# Patient Record
Sex: Male | Born: 1935 | Race: Black or African American | Hispanic: No | Marital: Married | Smoking: Former smoker
Health system: Southern US, Community
[De-identification: ages and names within clinical notes are randomized; demographics above are authoritative.]

## PROBLEM LIST (undated history)

## (undated) DIAGNOSIS — N183 Chronic kidney disease, stage 3 unspecified: Secondary | ICD-10-CM

## (undated) DIAGNOSIS — E669 Obesity, unspecified: Secondary | ICD-10-CM

## (undated) DIAGNOSIS — R001 Bradycardia, unspecified: Secondary | ICD-10-CM

## (undated) DIAGNOSIS — K922 Gastrointestinal hemorrhage, unspecified: Secondary | ICD-10-CM

## (undated) DIAGNOSIS — Z794 Long term (current) use of insulin: Secondary | ICD-10-CM

## (undated) DIAGNOSIS — G4733 Obstructive sleep apnea (adult) (pediatric): Secondary | ICD-10-CM

## (undated) DIAGNOSIS — I639 Cerebral infarction, unspecified: Secondary | ICD-10-CM

## (undated) DIAGNOSIS — E119 Type 2 diabetes mellitus without complications: Secondary | ICD-10-CM

## (undated) DIAGNOSIS — I10 Essential (primary) hypertension: Secondary | ICD-10-CM

## (undated) DIAGNOSIS — I5032 Chronic diastolic (congestive) heart failure: Secondary | ICD-10-CM

## (undated) DIAGNOSIS — I4892 Unspecified atrial flutter: Secondary | ICD-10-CM

## (undated) HISTORY — PX: PROSTATECTOMY: SHX69

---

## 2007-07-31 DIAGNOSIS — K922 Gastrointestinal hemorrhage, unspecified: Secondary | ICD-10-CM

## 2007-07-31 HISTORY — DX: Gastrointestinal hemorrhage, unspecified: K92.2

## 2009-06-29 HISTORY — PX: CARDIAC CATHETERIZATION: SHX172

## 2013-07-30 HISTORY — PX: US ECHOCARDIOGRAPHY: HXRAD669

## 2014-11-12 ENCOUNTER — Inpatient Hospital Stay (HOSPITAL_COMMUNITY)
Admission: EM | Admit: 2014-11-12 | Discharge: 2014-11-18 | DRG: 246 | Disposition: A | Discharge status: Home / Self Care | Payer: Medicare Other | Attending: Internal Medicine | Admitting: Internal Medicine

## 2014-11-12 ENCOUNTER — Encounter (HOSPITAL_COMMUNITY): Payer: Self-pay | Admitting: Emergency Medicine

## 2014-11-12 ENCOUNTER — Emergency Department (HOSPITAL_COMMUNITY): Payer: Medicare Other

## 2014-11-12 DIAGNOSIS — I249 Acute ischemic heart disease, unspecified: Secondary | ICD-10-CM | POA: Diagnosis present

## 2014-11-12 DIAGNOSIS — I252 Old myocardial infarction: Secondary | ICD-10-CM | POA: Diagnosis not present

## 2014-11-12 DIAGNOSIS — Z6834 Body mass index (BMI) 34.0-34.9, adult: Secondary | ICD-10-CM

## 2014-11-12 DIAGNOSIS — E11649 Type 2 diabetes mellitus with hypoglycemia without coma: Secondary | ICD-10-CM | POA: Diagnosis present

## 2014-11-12 DIAGNOSIS — F0391 Unspecified dementia with behavioral disturbance: Secondary | ICD-10-CM | POA: Diagnosis present

## 2014-11-12 DIAGNOSIS — I69359 Hemiplegia and hemiparesis following cerebral infarction affecting unspecified side: Secondary | ICD-10-CM | POA: Diagnosis not present

## 2014-11-12 DIAGNOSIS — I2510 Atherosclerotic heart disease of native coronary artery without angina pectoris: Secondary | ICD-10-CM | POA: Diagnosis present

## 2014-11-12 DIAGNOSIS — G4733 Obstructive sleep apnea (adult) (pediatric): Secondary | ICD-10-CM | POA: Diagnosis present

## 2014-11-12 DIAGNOSIS — I10 Essential (primary) hypertension: Secondary | ICD-10-CM | POA: Diagnosis not present

## 2014-11-12 DIAGNOSIS — I5021 Acute systolic (congestive) heart failure: Secondary | ICD-10-CM

## 2014-11-12 DIAGNOSIS — E118 Type 2 diabetes mellitus with unspecified complications: Secondary | ICD-10-CM | POA: Diagnosis not present

## 2014-11-12 DIAGNOSIS — N183 Chronic kidney disease, stage 3 (moderate): Secondary | ICD-10-CM | POA: Diagnosis present

## 2014-11-12 DIAGNOSIS — I5043 Acute on chronic combined systolic (congestive) and diastolic (congestive) heart failure: Secondary | ICD-10-CM | POA: Diagnosis present

## 2014-11-12 DIAGNOSIS — Z794 Long term (current) use of insulin: Secondary | ICD-10-CM

## 2014-11-12 DIAGNOSIS — I248 Other forms of acute ischemic heart disease: Secondary | ICD-10-CM | POA: Diagnosis present

## 2014-11-12 DIAGNOSIS — Z9119 Patient's noncompliance with other medical treatment and regimen: Secondary | ICD-10-CM | POA: Diagnosis present

## 2014-11-12 DIAGNOSIS — Z6830 Body mass index (BMI) 30.0-30.9, adult: Secondary | ICD-10-CM

## 2014-11-12 DIAGNOSIS — N179 Acute kidney failure, unspecified: Secondary | ICD-10-CM | POA: Diagnosis present

## 2014-11-12 DIAGNOSIS — Z833 Family history of diabetes mellitus: Secondary | ICD-10-CM | POA: Diagnosis not present

## 2014-11-12 DIAGNOSIS — R410 Disorientation, unspecified: Secondary | ICD-10-CM | POA: Diagnosis not present

## 2014-11-12 DIAGNOSIS — J9601 Acute respiratory failure with hypoxia: Secondary | ICD-10-CM | POA: Diagnosis not present

## 2014-11-12 DIAGNOSIS — Z9861 Coronary angioplasty status: Secondary | ICD-10-CM

## 2014-11-12 DIAGNOSIS — Z9114 Patient's other noncompliance with medication regimen: Secondary | ICD-10-CM | POA: Diagnosis not present

## 2014-11-12 DIAGNOSIS — Z955 Presence of coronary angioplasty implant and graft: Secondary | ICD-10-CM

## 2014-11-12 DIAGNOSIS — E669 Obesity, unspecified: Secondary | ICD-10-CM | POA: Diagnosis present

## 2014-11-12 DIAGNOSIS — R310 Gross hematuria: Secondary | ICD-10-CM | POA: Diagnosis not present

## 2014-11-12 DIAGNOSIS — I25110 Atherosclerotic heart disease of native coronary artery with unstable angina pectoris: Secondary | ICD-10-CM | POA: Diagnosis not present

## 2014-11-12 DIAGNOSIS — R7989 Other specified abnormal findings of blood chemistry: Secondary | ICD-10-CM

## 2014-11-12 DIAGNOSIS — Z8249 Family history of ischemic heart disease and other diseases of the circulatory system: Secondary | ICD-10-CM

## 2014-11-12 DIAGNOSIS — Z87891 Personal history of nicotine dependence: Secondary | ICD-10-CM

## 2014-11-12 DIAGNOSIS — I5031 Acute diastolic (congestive) heart failure: Secondary | ICD-10-CM | POA: Diagnosis not present

## 2014-11-12 DIAGNOSIS — R778 Other specified abnormalities of plasma proteins: Secondary | ICD-10-CM | POA: Diagnosis present

## 2014-11-12 DIAGNOSIS — R251 Tremor, unspecified: Secondary | ICD-10-CM | POA: Diagnosis present

## 2014-11-12 DIAGNOSIS — E119 Type 2 diabetes mellitus without complications: Secondary | ICD-10-CM

## 2014-11-12 DIAGNOSIS — R0600 Dyspnea, unspecified: Secondary | ICD-10-CM | POA: Diagnosis not present

## 2014-11-12 DIAGNOSIS — I214 Non-ST elevation (NSTEMI) myocardial infarction: Secondary | ICD-10-CM | POA: Diagnosis not present

## 2014-11-12 DIAGNOSIS — I509 Heart failure, unspecified: Secondary | ICD-10-CM | POA: Insufficient documentation

## 2014-11-12 DIAGNOSIS — I129 Hypertensive chronic kidney disease with stage 1 through stage 4 chronic kidney disease, or unspecified chronic kidney disease: Secondary | ICD-10-CM | POA: Diagnosis present

## 2014-11-12 DIAGNOSIS — R0602 Shortness of breath: Secondary | ICD-10-CM | POA: Diagnosis present

## 2014-11-12 HISTORY — DX: Gastrointestinal hemorrhage, unspecified: K92.2

## 2014-11-12 HISTORY — DX: Bradycardia, unspecified: R00.1

## 2014-11-12 HISTORY — DX: Chronic diastolic (congestive) heart failure: I50.32

## 2014-11-12 HISTORY — DX: Obstructive sleep apnea (adult) (pediatric): G47.33

## 2014-11-12 HISTORY — DX: Essential (primary) hypertension: I10

## 2014-11-12 HISTORY — DX: Cerebral infarction, unspecified: I63.9

## 2014-11-12 HISTORY — DX: Long term (current) use of insulin: Z79.4

## 2014-11-12 HISTORY — DX: Obesity, unspecified: E66.9

## 2014-11-12 HISTORY — DX: Chronic kidney disease, stage 3 unspecified: N18.30

## 2014-11-12 HISTORY — DX: Chronic kidney disease, stage 3 (moderate): N18.3

## 2014-11-12 HISTORY — DX: Unspecified atrial flutter: I48.92

## 2014-11-12 HISTORY — DX: Type 2 diabetes mellitus without complications: E11.9

## 2014-11-12 LAB — TSH: TSH: 3.07 u[IU]/mL (ref 0.350–4.500)

## 2014-11-12 LAB — CBC
HCT: 43.1 % (ref 39.0–52.0)
Hemoglobin: 13.9 g/dL (ref 13.0–17.0)
MCH: 29 pg (ref 26.0–34.0)
MCHC: 32.3 g/dL (ref 30.0–36.0)
MCV: 90 fL (ref 78.0–100.0)
PLATELETS: 363 10*3/uL (ref 150–400)
RBC: 4.79 MIL/uL (ref 4.22–5.81)
RDW: 13.1 % (ref 11.5–15.5)
WBC: 9.7 10*3/uL (ref 4.0–10.5)

## 2014-11-12 LAB — HEPATIC FUNCTION PANEL
ALK PHOS: 77 U/L (ref 39–117)
ALT: 26 U/L (ref 0–53)
AST: 26 U/L (ref 0–37)
Albumin: 3 g/dL — ABNORMAL LOW (ref 3.5–5.2)
BILIRUBIN INDIRECT: 0.4 mg/dL (ref 0.3–0.9)
Bilirubin, Direct: 0.1 mg/dL (ref 0.0–0.5)
TOTAL PROTEIN: 8.1 g/dL (ref 6.0–8.3)
Total Bilirubin: 0.5 mg/dL (ref 0.3–1.2)

## 2014-11-12 LAB — BASIC METABOLIC PANEL
Anion gap: 10 (ref 5–15)
BUN: 11 mg/dL (ref 6–23)
CO2: 28 mmol/L (ref 19–32)
Calcium: 8.9 mg/dL (ref 8.4–10.5)
Chloride: 105 mmol/L (ref 96–112)
Creatinine, Ser: 1.53 mg/dL — ABNORMAL HIGH (ref 0.50–1.35)
GFR calc non Af Amer: 41 mL/min — ABNORMAL LOW (ref >90)
GFR, EST AFRICAN AMERICAN: 48 mL/min — AB (ref >90)
GLUCOSE: 173 mg/dL — AB (ref 70–99)
POTASSIUM: 3.7 mmol/L (ref 3.5–5.1)
SODIUM: 143 mmol/L (ref 135–145)

## 2014-11-12 LAB — GLUCOSE, CAPILLARY
GLUCOSE-CAPILLARY: 152 mg/dL — AB (ref 70–99)
GLUCOSE-CAPILLARY: 133 mg/dL — AB (ref 70–99)
GLUCOSE-CAPILLARY: 112 mg/dL — AB (ref 70–99)
GLUCOSE-CAPILLARY: 178 mg/dL — AB (ref 70–99)
Glucose-Capillary: 131 mg/dL — ABNORMAL HIGH (ref 70–99)
Glucose-Capillary: 127 mg/dL — ABNORMAL HIGH (ref 70–99)

## 2014-11-12 LAB — HEPARIN LEVEL (UNFRACTIONATED): HEPARIN UNFRACTIONATED: 0.1 [IU]/mL — AB (ref 0.30–0.70)

## 2014-11-12 LAB — TROPONIN I
TROPONIN I: 0.63 ng/mL — AB (ref <0.031)
Troponin I: 0.72 ng/mL (ref <0.031)
Troponin I: 0.78 ng/mL (ref <0.031)
Troponin I: 0.7 ng/mL (ref <0.031)

## 2014-11-12 LAB — BRAIN NATRIURETIC PEPTIDE
B NATRIURETIC PEPTIDE 5: 494.4 pg/mL — AB (ref 0.0–100.0)
B Natriuretic Peptide: 469.1 pg/mL — ABNORMAL HIGH (ref 0.0–100.0)

## 2014-11-12 LAB — D-DIMER, QUANTITATIVE (NOT AT ARMC): D DIMER QUANT: 0.62 ug{FEU}/mL — AB (ref 0.00–0.48)

## 2014-11-12 LAB — MRSA PCR SCREENING: MRSA BY PCR: NEGATIVE

## 2014-11-12 MED ORDER — LISINOPRIL 20 MG PO TABS
20.0000 mg | ORAL_TABLET | Freq: Every day | ORAL | Status: DC
  Administered 2014-11-12 – 2014-11-14 (×3): 20 mg via ORAL
  Filled 2014-11-12 (×3): qty 1

## 2014-11-12 MED ORDER — FUROSEMIDE 10 MG/ML IJ SOLN
60.0000 mg | Freq: Once | INTRAMUSCULAR | Status: AC
  Administered 2014-11-12: 60 mg via INTRAVENOUS
  Filled 2014-11-12: qty 6

## 2014-11-12 MED ORDER — ASPIRIN 81 MG PO CHEW
324.0000 mg | CHEWABLE_TABLET | Freq: Once | ORAL | Status: AC
  Administered 2014-11-12: 324 mg via ORAL
  Filled 2014-11-12: qty 4

## 2014-11-12 MED ORDER — INSULIN ASPART 100 UNIT/ML ~~LOC~~ SOLN
0.0000 [IU] | Freq: Three times a day (TID) | SUBCUTANEOUS | Status: DC
  Administered 2014-11-12: 2 [IU] via SUBCUTANEOUS
  Administered 2014-11-14: 1 [IU] via SUBCUTANEOUS

## 2014-11-12 MED ORDER — SIMVASTATIN 20 MG PO TABS
20.0000 mg | ORAL_TABLET | Freq: Every day | ORAL | Status: DC
  Administered 2014-11-12 – 2014-11-17 (×5): 20 mg via ORAL
  Filled 2014-11-12 (×6): qty 1

## 2014-11-12 MED ORDER — ASPIRIN-DIPYRIDAMOLE ER 25-200 MG PO CP12
1.0000 | ORAL_CAPSULE | Freq: Two times a day (BID) | ORAL | Status: DC
  Administered 2014-11-12 – 2014-11-17 (×11): 1 via ORAL
  Filled 2014-11-12 (×14): qty 1

## 2014-11-12 MED ORDER — HEPARIN BOLUS VIA INFUSION
4000.0000 [IU] | Freq: Once | INTRAVENOUS | Status: AC
  Administered 2014-11-12: 4000 [IU] via INTRAVENOUS
  Filled 2014-11-12: qty 4000

## 2014-11-12 MED ORDER — AMLODIPINE BESYLATE 10 MG PO TABS
10.0000 mg | ORAL_TABLET | Freq: Every day | ORAL | Status: DC
Start: 2014-11-12 — End: 2014-11-18
  Administered 2014-11-12 – 2014-11-18 (×7): 10 mg via ORAL
  Filled 2014-11-12 (×7): qty 1

## 2014-11-12 MED ORDER — HEPARIN BOLUS VIA INFUSION
2000.0000 [IU] | Freq: Once | INTRAVENOUS | Status: AC
  Administered 2014-11-12: 2000 [IU] via INTRAVENOUS
  Filled 2014-11-12: qty 2000

## 2014-11-12 MED ORDER — INSULIN ASPART 100 UNIT/ML ~~LOC~~ SOLN
3.0000 [IU] | Freq: Three times a day (TID) | SUBCUTANEOUS | Status: DC
  Administered 2014-11-14 – 2014-11-15 (×4): 3 [IU] via SUBCUTANEOUS

## 2014-11-12 MED ORDER — INSULIN GLARGINE 100 UNIT/ML ~~LOC~~ SOLN
74.0000 [IU] | Freq: Every day | SUBCUTANEOUS | Status: DC
  Administered 2014-11-12: 74 [IU] via SUBCUTANEOUS
  Filled 2014-11-12 (×2): qty 0.74

## 2014-11-12 MED ORDER — HYDRALAZINE HCL 50 MG PO TABS
50.0000 mg | ORAL_TABLET | Freq: Three times a day (TID) | ORAL | Status: DC
  Administered 2014-11-12 – 2014-11-18 (×16): 50 mg via ORAL
  Filled 2014-11-12 (×20): qty 1

## 2014-11-12 MED ORDER — HEPARIN (PORCINE) IN NACL 100-0.45 UNIT/ML-% IJ SOLN
1600.0000 [IU]/h | INTRAMUSCULAR | Status: DC
  Administered 2014-11-12: 1100 [IU]/h via INTRAVENOUS
  Administered 2014-11-12: 1300 [IU]/h via INTRAVENOUS
  Administered 2014-11-13 – 2014-11-16 (×5): 1600 [IU]/h via INTRAVENOUS
  Filled 2014-11-12 (×10): qty 250

## 2014-11-12 MED ORDER — INSULIN GLARGINE 100 UNIT/ML SOLOSTAR PEN
74.0000 [IU] | PEN_INJECTOR | Freq: Every day | SUBCUTANEOUS | Status: DC
Start: 2014-11-12 — End: 2014-11-12

## 2014-11-12 MED ORDER — FUROSEMIDE 10 MG/ML IJ SOLN
40.0000 mg | Freq: Two times a day (BID) | INTRAMUSCULAR | Status: DC
  Administered 2014-11-12 – 2014-11-14 (×5): 40 mg via INTRAVENOUS
  Filled 2014-11-12 (×7): qty 4

## 2014-11-12 NOTE — ED Notes (Signed)
Pt O2 saturations dropping to 88%... Placed on 3L of O2, pt saturations up to 94%

## 2014-11-12 NOTE — Consult Note (Signed)
CARDIOLOGY CONSULT NOTE   Patient ID: [place] MRN: [id number] DOB/AGE: 01-Jul-1932 81 y.o.  Admit date: 11/12/2014  Primary Physician   Melida Quitter, MD Primary Cardiologist  Dr Delton See Seen, [facility], [place], [place] Reason for Consultation  Elevated troponin  [id number] [name] is a 81 y.o. year old male with a reported history of CAD, MI x 2 (latest was 3 years ago, required cath and stenting x 2), CVA, HTN, CHF, DM, HLD admitted 04/15 with shortness of breath x 3 days.   He states he developed shortness of breath 3 days ago; he is unable to identify anything that precipitates his shortness of breath. He denies chest pain, palpitations, nausea, diaphoresis, and recent illness. He states he felt like he "couldn't breathe" yesterday, which prompted him to come to the ED. He states this has never happened to him before. He reports he stopped taking his insulin 3 days ago, but has been compliant with his other medications. He is from [place], and is currently in [place] visiting his daughter.   On admission, troponin was elevated (0.72, 0.78, 0.63).  Today, he reports his shortness of breath is improved. He denies chest pain, palpitations, nausea, and diaphoresis.    Past Medical History  Diagnosis Date  . Stroke     left x 2  . Hypertension   . Diastolic CHF, chronic    . Atrial flutter   . UGIB (upper gastrointestinal bleed) 2009    duodenal ulcer  . OSA (obstructive sleep apnea)   . CKD (chronic kidney disease) stage 3, GFR 30-59 ml/min   . Sinus bradycardia     Not on BB  . Obesity   . Diabetes mellitus type 2, insulin dependent      Past Surgical History  Procedure Laterality Date  . Cardiac catheterization  06/2009    Dr Delton See Seen, [facility] in [place]. Lmain 50%, LAD 50% (FFR 92), RI 90% (small), CFX 40%, OM 100% w/ L-L collaterals, RCA 40%, PDA 50% EF 45%. Med rx  . Prostatectomy    . US echocardiography  2015    EF 50-55%,  mod-severe LVH, mild-mod MR, nl RV pressures, nl atrial size, Grade 1 diastolic dysfunction    No Known Allergies  I have reviewed the patient's current medications . amLODipine  10 mg Oral Daily  . dipyridamole-aspirin  1 capsule Oral BID  . furosemide  40 mg Intravenous BID  . hydrALAZINE  50 mg Oral TID  . insulin aspart  0-9 Units Subcutaneous TID WC  . insulin aspart  3 Units Subcutaneous TID WC  . insulin glargine  74 Units Subcutaneous QHS  . lisinopril  20 mg Oral Daily  . simvastatin  20 mg Oral q1800   . heparin 1,300 Units/hr (11/12/14 1506)     Prior to Admission medications   Medication Sig Start Date End Date Taking? Authorizing Provider  amLODipine (NORVASC) 10 MG tablet Take 10 mg by mouth daily.   Yes Historical Provider, MD  dipyridamole-aspirin (AGGRENOX) 200-25 MG per 12 hr capsule Take 1 capsule by mouth 2 (two) times daily.   Yes Historical Provider, MD  furosemide (LASIX) 40 MG tablet Take 40 mg by mouth daily.   Yes Historical Provider, MD  hydrALAZINE (APRESOLINE) 50 MG tablet Take 50 mg by mouth 3 (three) times daily.   Yes Historical Provider, MD  Insulin Glargine (LANTUS) 100 UNIT/ML Solostar Pen Inject 74 Units into the skin daily at 10 pm.  Yes Historical Provider, MD  lisinopril (PRINIVIL,ZESTRIL) 40 MG tablet Take 40 mg by mouth daily.   Yes Historical Provider, MD  potassium chloride (K-DUR,KLOR-CON) 10 MEQ tablet Take 10 mEq by mouth daily.   Yes Historical Provider, MD  simvastatin (ZOCOR) 20 MG tablet Take 20 mg by mouth daily.   Yes Historical Provider, MD     History   Social History  . Marital Status: Unknown    Spouse Name: N/A  . Number of Children: N/A  . Years of Education: N/A   Occupational History  . Retired    Social History Main Topics  . Smoking status: Former Games developermoker  . Smokeless tobacco: Former NeurosurgeonUser  . Alcohol Use: No  . Drug Use: No  . Sexual Activity: Not on file   Other Topics Concern  . Not on file   Social  History Narrative   Lives in [place]   Son visits doesn't currently drive      Poor vision after stroke circa 2013   Does most of his ADLs for himself    Family Status  Relation Status Death Age  . Mother Deceased   . Father Deceased    Family History  Problem Relation Age of Onset  . Hypertension Mother   . Diabetes    . Cancer Mother   . Cancer Father      ROS:  Full 14 point review of systems complete and found to be negative unless listed above.  Physical Exam: Blood pressure 141/94, pulse 79, temperature 98.7 F (37.1 C), temperature source Oral, resp. rate 17, height 5\' 10"  (1.778 m), weight 240 lb 15.4 oz (109.3 kg), SpO2 95 %.  General: Well developed male in no acute distress Head: Eyes PERRLA. Normocephalic and atraumatic. Lungs: Resp regular and unlabored. Poor inspiratory effort. CTA. Heart: RRR S1, S2, no rub/gallop, no murmur. Pulses are 2+ extrem.   Neck: No carotid bruits. No lymphadenopathy. JVD not elevated. Abdomen: Bowel sounds present, abdomen soft, non-tender, non-distended, without masses or hernias noted. Msk: No weakness, no joint deformities or effusions. Extremities: No clubbing or cyanosis. No edema.  Neuro: Alert and oriented X 3. No focal deficits noted. Psych: Poor historian, however responds appropriately to questions. Skin: No rashes or lesions noted.  Labs:   Lab Results  Component Value Date   WBC 9.7 11/12/2014   HGB 13.9 11/12/2014   HCT 43.1 11/12/2014   MCV 90.0 11/12/2014   PLT 363 11/12/2014    Recent Labs Lab 11/12/14 0341  NA 143  K 3.7  CL 105  CO2 28  BUN 11  CREATININE 1.53*  CALCIUM 8.9  PROT 8.1  BILITOT 0.5  ALKPHOS 77  ALT 26  AST 26  GLUCOSE 173*  ALBUMIN 3.0*   No results found for: MG  Recent Labs  11/12/14 0403 11/12/14 1014 11/12/14 1343  TROPONINI 0.72* 0.78* 0.63*   B NATRIURETIC PEPTIDE  Date/Time Value Ref Range Status  11/12/2014 01:40 PM 469.1* 0.0 - 100.0 pg/mL Final  11/12/2014  10:14 AM 494.4* 0.0 - 100.0 pg/mL Final   TSH  Date/Time Value Ref Range Status  11/12/2014 10:14 AM 3.070 0.350 - 4.500 uIU/mL Final    ECG:  SR, 87  ECHO: 01/13/2014 EF 50-55%, mod-severe LVH Mild-mod MR RV pressures nl Atrial size nl Grade 1 diastolic dysfunction  CATH: 07/27/2009 - MED RX Lmain 50% LAD 50% - FFR 92 RI 90% CFX 40% OM 100% RCA 40% PDA 50% EF 45%   Radiology:  Dg Chest 2 View  11/12/2014    CLINICAL DATA:  Shortness of breath  EXAM: CHEST  2 VIEW  COMPARISON:  None.  FINDINGS: There is mild cardiomegaly. The aorta is tortuous and prominent in caliber. Mild interstitial coarsening without edema or pneumonia. No effusion or pneumothorax. No acute osseous findings.  IMPRESSION: 1. No pneumonia or edema. 2. Mild cardiomegaly. 3. Tortuous and likely dilated aorta.   Electronically Signed   By: Marnee Spring M.D.   On: 11/12/2014 04:29    ASSESSMENT AND PLAN:   The patient was seen today by Dr. Elease Hashimoto, the patient evaluated and the data reviewed.   Principal Problem:   Acute heart failure, systolic - BNP 494.4, 469.1 - I/O net negative 1 L since admission today - daily weights - continue IV lasix 40 mg BID  - order echo, likely will need cath Monday 04/18 - attempting to obtain records from cardiologist   Active Problems:   Elevated troponin - denies chest pain - troponin 0.72, 0.78, 0.63 - continue heparin     HTN (hypertension) - systolic BP 137-175 since admission  - continue amlodipine, hydralazine, lisinopril    Shortness of breath - O2 sat stable (93-99) on 3L O2 - order d-dimer, follow-up results, if positive will order CT angio  Otherwise, per IM   CVA, old, hemiparesis   Diabetes mellitus ty 2   Obesity (BMI 30.0-34.9)   Former smoker   Tremor   Signed: Theodore Demark, PA-C 11/12/2014 5:25 PM Beeper [phone]  Co-Sign MD   Attending Note:   The patient was seen and examined.  Agree with assessment and plan as noted  above.  Changes made to the above note as needed.  Pt is a poor historian. He has recent worsening of his shortness of breath and + Troponin levels. I think that our best option is for cath.  Would keep him on heparin   Alvia Grove., MD, [facility] 11/12/2014, 5:33 PM [address]  [address] Office [phone] Pager [phone]

## 2014-11-12 NOTE — Progress Notes (Signed)
ANTICOAGULATION CONSULT NOTE - Follow-up  Pharmacy Consult for Heparin Indication: chest pain/ACS  No Known Allergies  Patient Measurements: Height: 5\' 10"  (177.8 cm) Weight: 240 lb 15.4 oz (109.3 kg) IBW/kg (Calculated) : 73  Vital Signs: Temp: 98.3 F (36.8 C) (04/15 1235) Temp Source: Oral (04/15 1235) BP: 157/92 mmHg (04/15 1235) Pulse Rate: 75 (04/15 1235)  Labs:  Recent Labs  11/12/14 0341 11/12/14 0403 11/12/14 1014 11/12/14 1341  HGB 13.9  --   --   --   HCT 43.1  --   --   --   PLT 363  --   --   --   HEPARINUNFRC  --   --   --  0.10*  CREATININE 1.53*  --   --   --   TROPONINI  --  0.72* 0.78*  --     Estimated Creatinine Clearance: 47.7 mL/min (by C-G formula based on Cr of 1.53).  Assessment: 81 y.o. male continues on IV heparin for CP/ACS. Initial heparin level is subtherapeutic at 0.1. No bleeding noted. Baseline CBC is WNL.   Goal of Therapy:  Heparin level 0.3-0.7 units/ml Monitor platelets by anticoagulation protocol: Yes   Plan:  - Heparin bolus 2000 units IV x 1 - Increase Heparin gtt to 1300 units/hr - Check an 8 hour HL - Daily HL and CBC  Lysle Pearlachel Lutricia Widjaja, PharmD, BCPS Pager # [phone] 11/12/2014 2:36 PM

## 2014-11-12 NOTE — ED Notes (Addendum)
[facility]. Tonight woke up with SHOB, mild wheezing/grunting, improved with 5MG  albuterol. Mild tremor from previous CVA. Ambulatory, [facility] exertion.

## 2014-11-12 NOTE — Progress Notes (Signed)
11/12/2014 [facility] to 2 central at [phone]. Patient is alert, oriented and unsteady on feet. He does uses a cane. Patient skin is dry, have a skin tag on the upper back. Patient was placed on bed alarm. He had no c/o pain while arrived on unit. He was placed on telemetry and Elink was notified of patient arrival. Lovie MacadamiaNadine Zabria Liss RN.

## 2014-11-12 NOTE — ED Notes (Signed)
[facility] records [facility]

## 2014-11-12 NOTE — ED Provider Notes (Signed)
CSN: [id number]     Arrival date & time 11/12/14  0255 History   First MD Initiated Contact with Patient 11/12/14 [phone]     Chief Complaint  Patient presents with  . Shortness of Breath     (Consider location/radiation/quality/duration/timing/severity/associated sxs/prior Treatment) Patient is a 81 y.o. male presenting with shortness of breath. The history is provided by the patient. No language interpreter was used.  Shortness of Breath Severity:  Moderate Onset quality:  Gradual Duration:  1 month Associated symptoms: no abdominal pain, no chest pain, no cough, no diaphoresis, no fever and no neck pain   Associated symptoms comment:  The patient reports he is visiting his daughter from [place], [place], and left his insulin at home. He states when he goes with his insulin he experiences exertional shortness of breath which is what prompted his visit to the emergency room tonight. He has had worsening SOB over the last 3 days. No chest pain, nausea, vomiting, cough or fever. He has had a history of CHF but denies swelling of his lower extremities. He has MI's x 2 in the past, the last one 3 years prior requiring catheterization and stenting x 2. All of his medical care is in [place].   Past Medical History  Diagnosis Date  . Acute MI   . Stroke     x2  . Hypertension    History reviewed. No pertinent past surgical history. No family history on file. History  Substance Use Topics  . Smoking status: Former Games developermoker  . Smokeless tobacco: Not on file  . Alcohol Use: No    Review of Systems  Constitutional: Negative for fever, chills and diaphoresis.  HENT: Negative.   Respiratory: Positive for shortness of breath. Negative for cough.   Cardiovascular: Negative.  Negative for chest pain and leg swelling.  Gastrointestinal: Negative.  Negative for nausea and abdominal pain.  Musculoskeletal: Negative.  Negative for myalgias and neck pain.  Skin: Negative.  Negative for color change.   Neurological: Negative.  Negative for weakness.      Allergies  Review of patient's allergies indicates no known allergies.  Home Medications   Prior to Admission medications   Not on File   BP 137/83 mmHg  Pulse 87  Temp(Src) 98.6 F (37 C) (Oral)  Resp 16  Ht 5\' 10"  (1.778 m)  Wt 200 lb (90.719 kg)  BMI 28.70 kg/m2  SpO2 89% Physical Exam  Constitutional: He is oriented to person, place, and time. He appears well-developed and well-nourished. No distress.  HENT:  Head: Normocephalic.  Neck: Normal range of motion. Neck supple.  Cardiovascular: Normal rate and regular rhythm.   Pulmonary/Chest: Effort normal and breath sounds normal. He has no wheezes.  Diffuse rales.  Abdominal: Soft. Bowel sounds are normal. There is no tenderness. There is no rebound and no guarding.  Musculoskeletal: Normal range of motion. He exhibits no edema.  Neurological: He is alert and oriented to person, place, and time.  Skin: Skin is warm and dry. No rash noted. He is not diaphoretic.  Psychiatric: He has a normal mood and affect.    ED Course  Procedures (including critical care time) Labs Review Labs Reviewed  BASIC METABOLIC PANEL  CBC  TROPONIN I   Results for orders placed or performed during the hospital encounter of 11/12/14  Basic metabolic panel    (if pt has PMH of COPD)  Result Value Ref Range   Sodium 143 135 - 145 mmol/L   Potassium  3.7 3.5 - 5.1 mmol/L   Chloride 105 96 - 112 mmol/L   CO2 28 19 - 32 mmol/L   Glucose, Bld 173 (H) 70 - 99 mg/dL   BUN 11 6 - 23 mg/dL   Creatinine, Ser 1.61 (H) 0.50 - 1.35 mg/dL   Calcium 8.9 8.4 - 09.6 mg/dL   GFR calc non Af Amer 41 (L) >90 mL/min   GFR calc Af Amer 48 (L) >90 mL/min   Anion gap 10 5 - 15  CBC     (if pt has PMH of COPD)  Result Value Ref Range   WBC 9.7 4.0 - 10.5 K/uL   RBC 4.79 4.22 - 5.81 MIL/uL   Hemoglobin 13.9 13.0 - 17.0 g/dL   HCT 04.5 40.9 - 81.1 %   MCV 90.0 78.0 - 100.0 fL   MCH 29.0 26.0 -  34.0 pg   MCHC 32.3 30.0 - 36.0 g/dL   RDW 91.4 78.2 - 95.6 %   Platelets 363 150 - 400 K/uL  Troponin I  Result Value Ref Range   Troponin I 0.72 (HH) <0.031 ng/mL   Dg Chest 2 View (if Patient Has Fever And/or Copd)  11/12/2014   CLINICAL DATA:  Shortness of breath  EXAM: CHEST  2 VIEW  COMPARISON:  None.  FINDINGS: There is mild cardiomegaly. The aorta is tortuous and prominent in caliber. Mild interstitial coarsening without edema or pneumonia. No effusion or pneumothorax. No acute osseous findings.  IMPRESSION: 1. No pneumonia or edema. 2. Mild cardiomegaly. 3. Tortuous and likely dilated aorta.   Electronically Signed   By: Marnee Spring M.D.   On: 11/12/2014 04:29    Imaging Review No results found.   EKG Interpretation None      MDM   Final diagnoses:  None    1. Dyspnea 2. Elevated troponin  Patient with dyspnea on exertion, room air pulse ox of 89%. No home O2 use. Nonspecific ST changes on EKG with troponin 0.72. He continues to deny pain. ASA provided. Will consult cardiology regarding further management.  VSS  Discussed with Dr. Shirlee Latch who advises probable CHF exacerbation. Recommends Lasix, heparin, medicine admit with cardiology consult (call after 7:00 for day team). The patient is in NAD and is comfortable.   Elpidio Anis, PA-C 11/12/14 2130  Mirian Mo, MD 11/12/14 0600

## 2014-11-12 NOTE — Progress Notes (Signed)
ANTICOAGULATION CONSULT NOTE - Initial Consult  Pharmacy Consult for Heparin Indication: chest pain/ACS  No Known Allergies  Patient Measurements: Height: 5\' 10"  (177.8 cm) Weight: 200 lb (90.719 kg) IBW/kg (Calculated) : 73  Vital Signs: Temp: 98.6 F (37 C) (04/15 0302) Temp Source: Oral (04/15 0302) BP: 148/76 mmHg (04/15 0511) Pulse Rate: 73 (04/15 0511)  Labs:  Recent Labs  11/12/14 0341 11/12/14 0403  HGB 13.9  --   HCT 43.1  --   PLT 363  --   CREATININE 1.53*  --   TROPONINI  --  0.72*    Estimated Creatinine Clearance: 43.6 mL/min (by C-G formula based on Cr of 1.53).   Medical History: Past Medical History  Diagnosis Date  . Acute MI   . Stroke     x2  . Hypertension     Medications:  Norvasc  Aggrenox  Lasix  Hydralazine  Lantus  Zestril  KCl  Xovor  Assessment: 81 y.o. male with SOB/elevayed cardiac markers for heparin  Goal of Therapy:  Heparin level 0.3-0.7 units/ml Monitor platelets by anticoagulation protocol: Yes   Plan:  Heparin 4000 units IV bolus, then start heparin 1100 units/hr Check heparin level in 8 hours.   Eddie Candlebbott, Dimitriy Carreras [place] 11/12/2014,6:10 AM

## 2014-11-12 NOTE — H&P (Signed)
[facility] History and Physical  Howell PringleSanders Fleagle ZOX:[id number] DOB: August 31, 1931 DOA: 11/12/2014  Referring physician: ED PCP: Melida QuitterSIDHU,AMANJOT, MD  Specialists: Cardiology  Chief Complaint: Shortness of breath  HPI:  81 y/o ? [facility]-here on visit to see his daughter in [place]. Prior history CAD with stent X2 2011[no records currently available], subsequent CVA 2012?, HTN, DM, HLD, morbid obesity,  [facility] of breath 4/14 - Chest pain, - cough, - chills, - fever, - blurred vision,  - Double vision, - rash, -6 contrast, - ill contacts, - diarrhea, - melena, - hematemesis, - swelling in lower or upper extremities  States noncompliant with usual heart healthy diet for the past 3 days as visiting and celebrating-no recent alcohol abuse or tobacco use Forgotten his medications at home in [place]--tells me his daughter is a diabetic and he was using her insulin at her home here. He is a moderate historian with relatively slow mentation however does remember some of his medical history  Emergency room workup = BUN/creatinine 11/1.53, albumin 3.0 Point of care troponin 0.7 Chest x-ray two-view showed no cardiomegaly EKG = PR interval 0.12 QRS axis 45, LVH by criteria and STDs V3 plus V5 greater than 35 mm, no ST-T wave changes however there is a little bit of QRS widening, I do not appreciate any specific ST-T wave changes   Review of Systems: A 14 point ROS was performed and is negative except as noted in the HPI   Past Medical History  Diagnosis Date  . Acute MI   . Stroke     x2  . Hypertension    History reviewed. No pertinent past surgical history. Social History:  History   Social History Narrative    No Known Allergies  No family history on file.   HE is not esure They are "dead"  Prior to Admission medications   Medication Sig Start Date End Date Taking? Authorizing Provider  amLODipine (NORVASC) 10 MG  tablet Take 10 mg by mouth daily.   Yes Historical Provider, MD  dipyridamole-aspirin (AGGRENOX) 200-25 MG per 12 hr capsule Take 1 capsule by mouth 2 (two) times daily.   Yes Historical Provider, MD  furosemide (LASIX) 40 MG tablet Take 40 mg by mouth daily.   Yes Historical Provider, MD  hydrALAZINE (APRESOLINE) 50 MG tablet Take 50 mg by mouth 3 (three) times daily.   Yes Historical Provider, MD  Insulin Glargine (LANTUS) 100 UNIT/ML Solostar Pen Inject 74 Units into the skin daily at 10 pm.   Yes Historical Provider, MD  lisinopril (PRINIVIL,ZESTRIL) 40 MG tablet Take 40 mg by mouth daily.   Yes Historical Provider, MD  potassium chloride (K-DUR,KLOR-CON) 10 MEQ tablet Take 10 mEq by mouth daily.   Yes Historical Provider, MD  simvastatin (ZOCOR) 20 MG tablet Take 20 mg by mouth daily.   Yes Historical Provider, MD   Physical Exam: Filed Vitals:   11/12/14 0600 11/12/14 0630 11/12/14 0700 11/12/14 0715  BP: 175/105 173/101 139/68 145/91  Pulse: 76 72 67 80  Temp:      TempSrc:      Resp: 15 16  16   Height:      Weight:      SpO2: 95% 97% 98% 98%    EOMI, obese, disheveled No pallor no icterus, arcus senilis Mallampati 3 Cannot appreciate JVD No bruit S1-S2 and no displacement of PMI Crackles bilaterally lower posterior lung fields Trace lower extremity edema Power 5/5 however tremors  noted bilaterally-more with intention  Labs on Admission:  Basic Metabolic Panel:  Recent Labs Lab 11/12/14 0341  NA 143  K 3.7  CL 105  CO2 28  GLUCOSE 173*  BUN 11  CREATININE 1.53*  CALCIUM 8.9   Liver Function Tests:  Recent Labs Lab 11/12/14 0341  AST 26  ALT 26  ALKPHOS 77  BILITOT 0.5  PROT 8.1  ALBUMIN 3.0*   No results for input(s): LIPASE, AMYLASE in the last 168 hours. No results for input(s): AMMONIA in the last 168 hours. CBC:  Recent Labs Lab 11/12/14 0341  WBC 9.7  HGB 13.9  HCT 43.1  MCV 90.0  PLT 363   Cardiac Enzymes:  Recent Labs Lab  11/12/14 0403  TROPONINI 0.72*    BNP (last 3 results) No results for input(s): BNP in the last 8760 hours.  ProBNP (last 3 results) No results for input(s): PROBNP in the last 8760 hours.  CBG: No results for input(s): GLUCAP in the last 168 hours.  Radiological Exams on Admission: Dg Chest 2 View (if Patient Has Fever And/or Copd)  11/12/2014   CLINICAL DATA:  Shortness of breath  EXAM: CHEST  2 VIEW  COMPARISON:  None.  FINDINGS: There is mild cardiomegaly. The aorta is tortuous and prominent in caliber. Mild interstitial coarsening without edema or pneumonia. No effusion or pneumothorax. No acute osseous findings.  IMPRESSION: 1. No pneumonia or edema. 2. Mild cardiomegaly. 3. Tortuous and likely dilated aorta.   Electronically Signed   By: Marnee Spring M.D.   On: 11/12/2014 04:29      Assessment/Plan Principal Problem:   Acute heart failure, systolic [presumed]-appreciate emergency room giving patient IV Lasix. I suspect the patient has demand ischemia from decompensated heart failure. He has no current ischemic symptoms chest pain no matter how I question him. Cardiology is being consulted to see him I have asked emergency room staff to get notes from [facility] so that we can complete his care. At this time I will hold off on getting echocardiogram or further workup. Keep him on heparin until cardiology sees him. Given he has no chest pain he may be able to come off of the heparin, probably does not need nitroglycerin at this time. Note the patient was given aspirin 325 mg by emergency room.  Given his acute decompensated state I would not place him on a beta blocker at present time-defer to cardiology.    Elevated troponin-secondary to demand ischemia-cycle troponins, EKG a.m. etc. etc. As above I will not echo him until I get further reports from his cardiologist in [place]   CVA, old, hemiparesis-continue Aggrenox   Diabetes mellitus ty 2-usually takes  74 units of Lantus daily. For some reason patient is not on any short acting medication??-We will need to confirm his meds with pharmacy representative.      HTN (hypertension)-continue amlodipine 10 mg, hydralazine 50 3 times a day, would hold or cut back dose lisinopril which are done from 40 9 g-->20.   Obesity (BMI 30.0-34.9)-outpatient counseling   Former smoker-congratulated on his nonsmoking choices   History of OSA-noncompliant with CPAP for the past year. We will need to investigate this and his primary care physician/pulmonologist will need to give some input regarding this   Cardiology has been consulted I emergency room and will see the patient in consult today  No family at bedside Full CODE STATUS  Time spent: 55 minutes  Mahala Menghini [facility] Pager [phone]  If 7PM-7AM, please contact night-coverage [email or web address] Password [facility] 11/12/2014, 7:34 AM

## 2014-11-12 NOTE — Care Management Note (Signed)
    Page 1 of 1   11/12/2014     9:35:51 AM CARE MANAGEMENT NOTE 11/12/2014  Patient:  Brendan Torres,Brendan Torres   Account Number:  [id number]  Date Initiated:  11/12/2014  Documentation initiated by:  Junius CreamerWELL,DEBBIE  Subjective/Objective Assessment:   adm w heart failure     Action/Plan:   lives w fam, pcp dr Neta Mendsamanjot sidhu   Anticipated DC Date:     Anticipated DC Plan:  HOME W HOME HEALTH SERVICES         Choice offered to / List presented to:             Status of service:   Medicare Important Message given?   (If response is "NO", the following Medicare IM given date fields will be blank) Date Medicare IM given:   Medicare IM given by:   Date Additional Medicare IM given:   Additional Medicare IM given by:    Discharge Disposition:    Per UR Regulation:  Reviewed for med. necessity/level of care/duration of stay  If discussed at Long Length of Stay Meetings, dates discussed:    Comments:

## 2014-11-12 NOTE — Evaluation (Signed)
Pt presenting w/ dypsnea x 1 month, elevated trop. Presumed CHF flare. Reported baseline hx/o CVA and CAD. Hemodynamically stable per report. CBC and BMET WNL apart from Cr 1.53. Trop 0.72. EKG NSR w/ nonspecific EKG changes. CXR w/ mild cardiomegaly and tortuous-dilated aorta. BNP pending.  NO CP per EDPA. EDPA spoke w/ Dr. Shirlee LatchMcLean who recommended medical admission. Cards will consult. Stared on heparin gtt. Discussed case w/ ED attending (phipher). Felt pt stable for medical admission pending cards consult. Accepted to stepdown bed.

## 2014-11-12 NOTE — Progress Notes (Signed)
11/12/2014 Foley cath was placed at 1353. He had clear yellow urine and tolerate it well. [facility] Amily Depp. RN.

## 2014-11-12 NOTE — ED Notes (Signed)
Dr at bedside.

## 2014-11-12 NOTE — ED Notes (Addendum)
Attempted report x 2 

## 2014-11-13 DIAGNOSIS — I25110 Atherosclerotic heart disease of native coronary artery with unstable angina pectoris: Secondary | ICD-10-CM

## 2014-11-13 DIAGNOSIS — I69359 Hemiplegia and hemiparesis following cerebral infarction affecting unspecified side: Secondary | ICD-10-CM

## 2014-11-13 DIAGNOSIS — R0600 Dyspnea, unspecified: Secondary | ICD-10-CM | POA: Diagnosis present

## 2014-11-13 DIAGNOSIS — E118 Type 2 diabetes mellitus with unspecified complications: Secondary | ICD-10-CM

## 2014-11-13 DIAGNOSIS — R0602 Shortness of breath: Secondary | ICD-10-CM

## 2014-11-13 DIAGNOSIS — I10 Essential (primary) hypertension: Secondary | ICD-10-CM

## 2014-11-13 LAB — GLUCOSE, CAPILLARY
GLUCOSE-CAPILLARY: 41 mg/dL — AB (ref 70–99)
GLUCOSE-CAPILLARY: 110 mg/dL — AB (ref 70–99)
GLUCOSE-CAPILLARY: 118 mg/dL — AB (ref 70–99)
Glucose-Capillary: 105 mg/dL — ABNORMAL HIGH (ref 70–99)
Glucose-Capillary: 169 mg/dL — ABNORMAL HIGH (ref 70–99)

## 2014-11-13 LAB — BASIC METABOLIC PANEL
Anion gap: 10 (ref 5–15)
BUN: 12 mg/dL (ref 6–23)
CO2: 33 mmol/L — AB (ref 19–32)
CREATININE: 1.55 mg/dL — AB (ref 0.50–1.35)
Calcium: 9 mg/dL (ref 8.4–10.5)
Chloride: 103 mmol/L (ref 96–112)
GFR calc non Af Amer: 41 mL/min — ABNORMAL LOW (ref >90)
GFR, EST AFRICAN AMERICAN: 47 mL/min — AB (ref >90)
Glucose, Bld: 45 mg/dL — ABNORMAL LOW (ref 70–99)
POTASSIUM: 3.4 mmol/L — AB (ref 3.5–5.1)
Sodium: 146 mmol/L — ABNORMAL HIGH (ref 135–145)

## 2014-11-13 LAB — CBC
HCT: 45.7 % (ref 39.0–52.0)
Hemoglobin: 14.5 g/dL (ref 13.0–17.0)
MCH: 28.8 pg (ref 26.0–34.0)
MCHC: 31.7 g/dL (ref 30.0–36.0)
MCV: 90.7 fL (ref 78.0–100.0)
Platelets: 358 10*3/uL (ref 150–400)
RBC: 5.04 MIL/uL (ref 4.22–5.81)
RDW: 13.2 % (ref 11.5–15.5)
WBC: 8.6 10*3/uL (ref 4.0–10.5)

## 2014-11-13 LAB — HEPARIN LEVEL (UNFRACTIONATED)
Heparin Unfractionated: 0.24 IU/mL — ABNORMAL LOW (ref 0.30–0.70)
Heparin Unfractionated: 0.28 IU/mL — ABNORMAL LOW (ref 0.30–0.70)
Heparin Unfractionated: 0.3 IU/mL (ref 0.30–0.70)

## 2014-11-13 MED ORDER — INSULIN GLARGINE 100 UNIT/ML ~~LOC~~ SOLN
40.0000 [IU] | Freq: Every day | SUBCUTANEOUS | Status: DC
  Administered 2014-11-13 – 2014-11-14 (×2): 40 [IU] via SUBCUTANEOUS
  Filled 2014-11-13 (×3): qty 0.4

## 2014-11-13 MED ORDER — CETYLPYRIDINIUM CHLORIDE 0.05 % MT LIQD
7.0000 mL | Freq: Two times a day (BID) | OROMUCOSAL | Status: DC
  Administered 2014-11-13 – 2014-11-16 (×5): 7 mL via OROMUCOSAL

## 2014-11-13 MED ORDER — POTASSIUM CHLORIDE CRYS ER 20 MEQ PO TBCR
20.0000 meq | EXTENDED_RELEASE_TABLET | Freq: Two times a day (BID) | ORAL | Status: DC
  Administered 2014-11-13 – 2014-11-17 (×8): 20 meq via ORAL
  Filled 2014-11-13 (×9): qty 1

## 2014-11-13 NOTE — Progress Notes (Signed)
D-Dimer 0.62. NP notified. No new orders for CT angio at this time.   M.Foster SimpsonPiel, RN

## 2014-11-13 NOTE — Progress Notes (Signed)
  Echocardiogram 2D Echocardiogram has been performed.  Arvil ChacoFoster, Emmy Keng 11/13/2014, 9:49 AM

## 2014-11-13 NOTE — Progress Notes (Signed)
[facility] TEAM 1 - Stepdown/ICU TEAM Progress Note  Brendan Torres ZOX:[id number] DOB: July 22, 1932 DOA: 11/12/2014 PCP: Melida QuitterSIDHU,AMANJOT, MD  Admit HPI / Brief Narrative: 81 y/o M resident of [place] here on a visit to see his daughter in [place] w/ a knwon history of CAD with stent X2 2011, CVA 2012?, HTN, DM, HLD, and morbid obesity who presented to [facility] c/o shortness of breath.  He admitted he had been noncompliant with usual heart healthy diet for 3 days and had forgotten his medications at home in [place].  In the ED his point of care troponin was 0.7.    HPI/Subjective: The patient has me he wishes to leave the hospital and go home today.  I have advised him that I do not feel it is safe for him to leave him that if he does so it will have to be AGAINST MEDICAL ADVICE.  I have explained him the repercussions of doing so to include sudden cardiac death.  He presently denies shortness of breath chest pain nausea vomiting or abdominal pain but is requiring 3 L of nasal cannula oxygen to keep his saturations at 90% or better.  Assessment/Plan:  Acute diastolic CHF exacerbation causing acute hypoxic respiratory failure Negative approximately 1.8 L since admission - continue diuresis - follow I's and O's and weights - wean oxygen as able  Elevated troponin w/o chest pain  Peaked at 0.78 - cardiology following and planning for cardiac cath on Monday - continue IV heparin  Mildly elevated d-dimer Currently covered with IV heparin - check venous duplex bilateral lower extremities - avoid CT angiogram for now and attempt to minimize contrast exposure with upcoming planned cardiac cath  CVA, old, hemiparesis continue Aggrenox - no new deficits appreciated  Diabetes mellitus 2 CBGs currently well controlled  HTN Blood pressure currently well controlled  CKD (chronic kidney disease) stage 3, GFR 30-59 ml/min Creatinine currently stable at reported  baseline  Obesity - Body mass index is 34.01 kg/(m^2).   Hx of OSA noncompliant w/ CPAP   Noncompliance  Counseled patient on repercussions of leaving AMA and absolute need to comply with medical therapy to assure for his safety  Code Status: FULL Family Communication: Spoke with patient and his wife at the bedside Disposition Plan: SDU  [facility]  Procedures: TTE - 4/17 - mild LVH, ejection fraction 55-60%, akinesis of the inferolateral myocardium, grade 1 diastolic dysfunction  Antibiotics: None  DVT prophylaxis: IV heparin  Objective: Blood pressure 129/70, pulse 71, temperature 98.3 F (36.8 C), temperature source Oral, resp. rate 11, height 5\' 10"  (1.778 m), weight 107.5 kg (236 lb 15.9 oz), SpO2 94 %.  Intake/Output Summary (Last 24 hours) at 11/13/14 1430 Last data filed at 11/13/14 0600  Gross per 24 hour  Intake  163.4 ml  Output    850 ml  Net -686.6 ml   Exam: General: No acute respiratory distress laying in bed Lungs: Clear to auscultation bilaterally without wheezes or crackles Cardiovascular: Regular rate and rhythm without murmur gallop or rub normal S1 and S2 Abdomen: Nontender, nondistended, soft, bowel sounds positive, no rebound, no ascites, no appreciable mass Extremities: No significant cyanosis, clubbing;  trace edema bilateral lower extremities  Data Reviewed: Basic Metabolic Panel:  Recent Labs Lab 11/12/14 0341 11/13/14 0312  NA 143 146*  K 3.7 3.4*  CL 105 103  CO2 28 33*  GLUCOSE 173* 45*  BUN 11 12  CREATININE 1.53* 1.55*  CALCIUM 8.9 9.0  Liver Function Tests:  Recent Labs Lab 11/12/14 0341  AST 26  ALT 26  ALKPHOS 77  BILITOT 0.5  PROT 8.1  ALBUMIN 3.0*    CBC:  Recent Labs Lab 11/12/14 0341 11/13/14 0312  WBC 9.7 8.6  HGB 13.9 14.5  HCT 43.1 45.7  MCV 90.0 90.7  PLT 363 358    Cardiac Enzymes:  Recent Labs Lab 11/12/14 0403 11/12/14 1014 11/12/14 1343 11/12/14 2054   TROPONINI 0.72* 0.78* 0.63* 0.70*    CBG:  Recent Labs Lab 11/12/14 1819 11/12/14 2008 11/13/14 0755 11/13/14 0909 11/13/14 1223  GLUCAP 178* 127* 41* 105* 110*    Recent Results (from the past 240 hour(s))  MRSA PCR Screening     Status: None   Collection Time: 11/12/14  8:26 AM  Result Value Ref Range Status   MRSA by PCR NEGATIVE NEGATIVE Final    Comment:        The GeneXpert MRSA Assay (FDA approved for NASAL specimens only), is one component of a comprehensive MRSA colonization surveillance program. It is not intended to diagnose MRSA infection nor to guide or monitor treatment for MRSA infections.      Studies:   Recent x-ray studies have been reviewed in detail by the Attending Physician  Scheduled Meds:  Scheduled Meds: . amLODipine  10 mg Oral Daily  . antiseptic oral rinse  7 mL Mouth Rinse BID  . dipyridamole-aspirin  1 capsule Oral BID  . furosemide  40 mg Intravenous BID  . hydrALAZINE  50 mg Oral TID  . insulin aspart  0-9 Units Subcutaneous TID WC  . insulin aspart  3 Units Subcutaneous TID WC  . insulin glargine  74 Units Subcutaneous QHS  . lisinopril  20 mg Oral Daily  . simvastatin  20 mg Oral q1800    Time spent on care of this patient: 35 mins   Sherleen Pangborn T , MD   [facility] Office  [phone] Pager - Text Page per Loretha Stapler as per below:  On-Call/Text Page:      [email or web address]      password [email or web address]  If 7PM-7AM, please contact night-coverage [email or web address] Password [email or web address] 11/13/2014, 2:30 PM   LOS: 1 day

## 2014-11-13 NOTE — Progress Notes (Addendum)
ANTICOAGULATION CONSULT NOTE - Follow Up Consult  Pharmacy Consult for Heparin  Indication: chest pain/ACS  No Known Allergies  Patient Measurements: Height: 5\' 10"  (177.8 cm) Weight: 236 lb 15.9 oz (107.5 kg) IBW/kg (Calculated) : 73  Vital Signs: Temp: 97 F (36.1 C) (04/16 0824) Temp Source: Oral (04/16 0824) BP: 138/86 mmHg (04/16 1003) Pulse Rate: 84 (04/16 0824)  Labs:  Recent Labs  11/12/14 0341  11/12/14 1014 11/12/14 1341 11/12/14 1343 11/12/14 2054 11/12/14 2348 11/13/14 0312 11/13/14 0912  HGB 13.9  --   --   --   --   --   --  14.5  --   HCT 43.1  --   --   --   --   --   --  45.7  --   PLT 363  --   --   --   --   --   --  358  --   HEPARINUNFRC  --   --   --  0.10*  --   --  0.28*  --  0.30  CREATININE 1.53*  --   --   --   --   --   --  1.55*  --   TROPONINI  --   < > 0.78*  --  0.63* 0.70*  --   --   --   < > = values in this interval not displayed.  Estimated Creatinine Clearance: 46.7 mL/min (by C-G formula based on Cr of 1.55).   . heparin 1,450 Units/hr (11/13/14 0104)     Assessment: 81 yo male on IV heparin for r/o ACS.  Heparin level now therapeutic on heparin at 1450 units/hr.  No bleeding or complications noted.  Awaiting cath lab.  Goal of Therapy:  Heparin level 0.3-0.7 units/ml Monitor platelets by anticoagulation protocol: Yes   Plan:  -Continue heparin 1450 units/hr -Confirm heparin level in 6 hrs. -Daily CBC/HL -Monitor for bleeding  Tad MooreJessica Carney, Pharm D, BCPS  Clinical Pharmacist Pager [phone]  11/13/2014 10:49 AM    ADDENDUM Patient's heparin level this evening dropped slightly below goal at 0.24 units/mL. No issues with infusion per RN Eber Jonesarolyn.  Plan: -Increase heparin infusion to 1600 units/hr -next heparin level with AM labs so minimized blood draws  Sade Mehlhoff D. Issaac Shipper, PharmD, BCPS Clinical Pharmacist Pager: [phone] 11/13/2014 4:49 PM

## 2014-11-13 NOTE — Progress Notes (Signed)
Dr. Sharon SellerMcClung in to see and patient states he wants to go home and has been saying this since morning. Encouraged to stay by MD and nurse and still saying that he is going home. Wife present at bedside and is against patient going home, states she can't see to  take care of him. Tried to call daughter and there is not a number in the chart, and patient and wife do not no contact number.

## 2014-11-13 NOTE — Progress Notes (Signed)
ANTICOAGULATION CONSULT NOTE - Follow Up Consult  Pharmacy Consult for Heparin  Indication: chest pain/ACS  No Known Allergies  Patient Measurements: Height: 5\' 10"  (177.8 cm) Weight: 240 lb 15.4 oz (109.3 kg) IBW/kg (Calculated) : 73  Vital Signs: Temp: 98.5 F (36.9 C) (04/15 2339) Temp Source: Oral (04/15 2339) BP: 94/54 mmHg (04/15 2340) Pulse Rate: 83 (04/15 2340)  Labs:  Recent Labs  11/12/14 0341  11/12/14 1014 11/12/14 1341 11/12/14 1343 11/12/14 2054 11/12/14 2348  HGB 13.9  --   --   --   --   --   --   HCT 43.1  --   --   --   --   --   --   PLT 363  --   --   --   --   --   --   HEPARINUNFRC  --   --   --  0.10*  --   --  0.28*  CREATININE 1.53*  --   --   --   --   --   --   TROPONINI  --   < > 0.78*  --  0.63* 0.70*  --   < > = values in this interval not displayed.  Estimated Creatinine Clearance: 47.7 mL/min (by C-G formula based on Cr of 1.53).  Assessment: Slightly sub-therapeutic heparin level after rate increase, no issues per RN.   Goal of Therapy:  Heparin level 0.3-0.7 units/ml Monitor platelets by anticoagulation protocol: Yes   Plan:  -Increase drip to 1450 units/hr -0900 HL -Daily CBC/HL -Monitor for bleeding  Abran DukeLedford, Janan Bogie 11/13/2014,12:51 AM

## 2014-11-13 NOTE — Progress Notes (Signed)
Patient's sats down to 84% with a good waveform. Upon arrival to room, oxygen cannula out of nose. Patient refusing to let RN place cannula back in nose despite education. Patient very irritable and stating 'they better let me go home today.' Sats back up to 96% at this time. Will keep on RA and continue to monitor and educate the patient.  Also, attempted to do foley care and patient is also refusing this be done. Will monitor and notify MD as needed.  M.Foster SimpsonPiel, RN

## 2014-11-13 NOTE — Progress Notes (Signed)
Notified Dr. Sharon SellerMcClung of hypogylcemia this am of 45. Orders to cut dose of Lantus insulin that is scheduled for tonight. Hypoglycemic protocol initiated and repeat glucose was 105.

## 2014-11-13 NOTE — Progress Notes (Signed)
CRITICAL VALUE ALERT  Critical value received: glu 45  Date of notification:  11/13/2014  Time of notification:  0800  Critical value read back:Yes.    Nurse who received alert: Lora Paulaarolyn Jemia Fata, RN  MD notified (1st page): Time of first page:    MD notified (2nd page):  Time of second page:  Responding MD:  Dr. Sharon SellerMcClung  Time MD responded:

## 2014-11-13 NOTE — Progress Notes (Signed)
Consulting cardiologist: Dr. Kristeen Miss Primary cardiologist: Dr Delton See Seen, [facility], [place], [place]  Seen for followup: Shortness of breath and abnormal troponin I, known CAD  Subjective:    No chest pain this morning or shortness of breath at rest. States that he wants to go home, does not seem inclined to pursue any further testing.  Objective:   Temp:  [97 F (36.1 C)-98.7 F (37.1 C)] 98.3 F (36.8 C) (04/16 1223) Pulse Rate:  [71-84] 71 (04/16 1223) Resp:  [10-24] 11 (04/16 1223) BP: (94-146)/(54-94) 129/70 mmHg (04/16 1223) SpO2:  [92 %-98 %] 94 % (04/16 1223) Weight:  [236 lb 15.9 oz (107.5 kg)] 236 lb 15.9 oz (107.5 kg) (04/16 0500) Last BM Date:  (unknown)  Filed Weights   11/12/14 0321 11/12/14 0759 11/13/14 0500  Weight: 200 lb (90.719 kg) 240 lb 15.4 oz (109.3 kg) 236 lb 15.9 oz (107.5 kg)    Intake/Output Summary (Last 24 hours) at 11/13/14 1236 Last data filed at 11/13/14 0600  Gross per 24 hour  Intake  163.4 ml  Output    850 ml  Net -686.6 ml    Telemetry: Sinus rhythm with artifact noted.  Exam:  General: Appears comfortable at rest.  Lungs: Clear, nonlabored.  Cardiac: RRR, no gallop  Abdomen: NABS.  Extremities: No pitting.  Lab Results:  Basic Metabolic Panel:  Recent Labs Lab 11/12/14 0341 11/13/14 0312  NA 143 146*  K 3.7 3.4*  CL 105 103  CO2 28 33*  GLUCOSE 173* 45*  BUN 11 12  CREATININE 1.53* 1.55*  CALCIUM 8.9 9.0    Liver Function Tests:  Recent Labs Lab 11/12/14 0341  AST 26  ALT 26  ALKPHOS 77  BILITOT 0.5  PROT 8.1  ALBUMIN 3.0*    CBC:  Recent Labs Lab 11/12/14 0341 11/13/14 0312  WBC 9.7 8.6  HGB 13.9 14.5  HCT 43.1 45.7  MCV 90.0 90.7  PLT 363 358    Cardiac Enzymes:  Recent Labs Lab 11/12/14 1014 11/12/14 1343 11/12/14 2054  TROPONINI 0.78* 0.63* 0.70*    D-Dimer: 0.62  ECG: Sinus rhythm with LVH and PVCs, repolarization abnormalities noted although  cannot exclude ischemic changes.   Medications:   Scheduled Medications: . amLODipine  10 mg Oral Daily  . antiseptic oral rinse  7 mL Mouth Rinse BID  . dipyridamole-aspirin  1 capsule Oral BID  . furosemide  40 mg Intravenous BID  . hydrALAZINE  50 mg Oral TID  . insulin aspart  0-9 Units Subcutaneous TID WC  . insulin aspart  3 Units Subcutaneous TID WC  . insulin glargine  74 Units Subcutaneous QHS  . lisinopril  20 mg Oral Daily  . simvastatin  20 mg Oral q1800    Infusions: . heparin 1,450 Units/hr (11/13/14 0104)    Assessment:   1. Recent onset worsening shortness of breath, concerning for ACS with mildly elevated troponin I levels and known history of CAD. ECG shows LVH with repolarization abnormalities, although ischemic changes are not excluded. Troponin I elevation is relatively flat, echocardiogram is pending for assessment of LVEF and wall motion abnormalities. Patient was seen by Dr. Elease Hashimoto with plan to pursue cardiac catheterization on Monday.  2. Mildly elevated d-dimer, pulmonary embolus seems less likely as cause of symptoms. He is on heparin at this time. Chest CT angiogram could be considered by primary team if felt to be indicated.  3. History of CAD status post previous percutaneous  intervention, details are not entirely clear. Records indicate moderate multivessel disease that was managed medically as of 2010, known occluded obtuse marginal with left to left collaterals and previous LVEF 50-55% as of 2015.  4. Type 2 diabetes mellitus.  5. Essential hypertension.  6. Reported history of atrial flutter, currently in sinus rhythm. He is not anticoagulated.   Plan/Discussion:    Reviewed consultation note and plan per Dr. Elease HashimotoNahser. Echocardiogram is pending. Patient states that he wants to go home today, and is not inclined to pursue further testing. I have recommended that he reconsider and undergo further evaluation to better clarify his cardiopulmonary  status. He states he will talk with family when they arrive today and communicate with the hospitalist team physician.   Jonelle SidleSamuel G. McDowell, M.D., F.A.C.C.

## 2014-11-14 DIAGNOSIS — R0602 Shortness of breath: Secondary | ICD-10-CM

## 2014-11-14 DIAGNOSIS — I214 Non-ST elevation (NSTEMI) myocardial infarction: Secondary | ICD-10-CM

## 2014-11-14 DIAGNOSIS — R0600 Dyspnea, unspecified: Secondary | ICD-10-CM

## 2014-11-14 DIAGNOSIS — R310 Gross hematuria: Secondary | ICD-10-CM | POA: Diagnosis not present

## 2014-11-14 LAB — BASIC METABOLIC PANEL
Anion gap: 9 (ref 5–15)
BUN: 19 mg/dL (ref 6–23)
CALCIUM: 8.7 mg/dL (ref 8.4–10.5)
CHLORIDE: 100 mmol/L (ref 96–112)
CO2: 33 mmol/L — ABNORMAL HIGH (ref 19–32)
CREATININE: 1.79 mg/dL — AB (ref 0.50–1.35)
GFR calc Af Amer: 40 mL/min — ABNORMAL LOW (ref >90)
GFR calc non Af Amer: 34 mL/min — ABNORMAL LOW (ref >90)
Glucose, Bld: 92 mg/dL (ref 70–99)
Potassium: 3.7 mmol/L (ref 3.5–5.1)
SODIUM: 142 mmol/L (ref 135–145)

## 2014-11-14 LAB — GLUCOSE, CAPILLARY
GLUCOSE-CAPILLARY: 105 mg/dL — AB (ref 70–99)
GLUCOSE-CAPILLARY: 131 mg/dL — AB (ref 70–99)
GLUCOSE-CAPILLARY: 154 mg/dL — AB (ref 70–99)
GLUCOSE-CAPILLARY: 171 mg/dL — AB (ref 70–99)
Glucose-Capillary: 77 mg/dL (ref 70–99)
Glucose-Capillary: 57 mg/dL — ABNORMAL LOW (ref 70–99)
Glucose-Capillary: 151 mg/dL — ABNORMAL HIGH (ref 70–99)

## 2014-11-14 LAB — CBC
HEMATOCRIT: 43.4 % (ref 39.0–52.0)
HEMOGLOBIN: 13.7 g/dL (ref 13.0–17.0)
MCH: 28.7 pg (ref 26.0–34.0)
MCHC: 31.6 g/dL (ref 30.0–36.0)
MCV: 91 fL (ref 78.0–100.0)
Platelets: 356 10*3/uL (ref 150–400)
RBC: 4.77 MIL/uL (ref 4.22–5.81)
RDW: 13.4 % (ref 11.5–15.5)
WBC: 9.9 10*3/uL (ref 4.0–10.5)

## 2014-11-14 LAB — HEPARIN LEVEL (UNFRACTIONATED)
Heparin Unfractionated: 0.35 IU/mL (ref 0.30–0.70)
Heparin Unfractionated: 0.51 IU/mL (ref 0.30–0.70)

## 2014-11-14 MED ORDER — SODIUM CHLORIDE 0.9 % IV SOLN
INTRAVENOUS | Status: DC
  Administered 2014-11-15: 75 mL/h via INTRAVENOUS
  Administered 2014-11-15: 04:00:00 via INTRAVENOUS

## 2014-11-14 NOTE — Progress Notes (Addendum)
ANTICOAGULATION CONSULT NOTE - Follow Up Consult  Pharmacy Consult for Heparin  Indication: chest pain/ACS  No Known Allergies  Patient Measurements: Height: 5\' 10"  (177.8 cm) Weight: 239 lb 10.2 oz (108.7 kg) IBW/kg (Calculated) : 73  Vital Signs: Temp: 97.9 F (36.6 C) (04/17 1153) Temp Source: Oral (04/17 1153) BP: 123/75 mmHg (04/17 1153) Pulse Rate: 70 (04/17 1153)  Labs:  Recent Labs  11/12/14 0341  11/12/14 1014  11/12/14 1343 11/12/14 2054  11/13/14 0312  11/13/14 1543 11/14/14 0325 11/14/14 1315  HGB 13.9  --   --   --   --   --   --  14.5  --   --  13.7  --   HCT 43.1  --   --   --   --   --   --  45.7  --   --  43.4  --   PLT 363  --   --   --   --   --   --  358  --   --  356  --   HEPARINUNFRC  --   --   --   < >  --   --   < >  --   < > 0.24* 0.35 0.51  CREATININE 1.53*  --   --   --   --   --   --  1.55*  --   --  1.79*  --   TROPONINI  --   < > 0.78*  --  0.63* 0.70*  --   --   --   --   --   --   < > = values in this interval not displayed.  Estimated Creatinine Clearance: 40.6 mL/min (by C-G formula based on Cr of 1.79).   Melene Muller. [START ON 11/15/2014] sodium chloride    . heparin 1,600 Units/hr (11/14/14 21300821)     Assessment: 81 yo male on IV heparin for r/o ACS.  Heparin level this AM therapeutic on heparin at 1600 units/hr.  No bleeding or complications noted.  Awaiting cath lab on Monday.  Goal of Therapy:  Heparin level 0.3-0.7 units/ml Monitor platelets by anticoagulation protocol: Yes   Plan:  -Continue heparin 1600 units/hr. -Confirm heparin level now. -Daily CBC/HL -Monitor for bleeding  Tad MooreJessica Kera Deacon, Pharm D, BCPS  Clinical Pharmacist Pager [phone]  11/14/2014 3:16 PM   Addendum: Repeat heparin level this afternoon - remains at goal.  Will continue IV heparin at current rate.  F/u AM heparin level.  Tad MooreJessica Harold Mattes, Pharm D, BCPS  Clinical Pharmacist Pager [phone]  11/14/2014 3:17 PM

## 2014-11-14 NOTE — Progress Notes (Signed)
Consulting cardiologist: Dr. Kristeen MissPhilip Nahser Primary cardiologist: Dr Delton SeeNelson Seen, [facility], [place], [place]  Seen for followup: Shortness of breath and abnormal troponin I, known CAD  Subjective:    Up in chair. No chest pain.  Objective:   Temp:  [98 F (36.7 C)-98.8 F (37.1 C)] 98 F (36.7 C) (04/17 0448) Pulse Rate:  [68-86] 76 (04/17 0754) Resp:  [11-16] 12 (04/17 0754) BP: (110-142)/(54-84) 137/77 mmHg (04/17 0754) SpO2:  [93 %-99 %] 96 % (04/17 0754) Weight:  [239 lb 10.2 oz (108.7 kg)] 239 lb 10.2 oz (108.7 kg) (04/17 0448) Last BM Date:  (unknown)  Filed Weights   11/12/14 0759 11/13/14 0500 11/14/14 0448  Weight: 240 lb 15.4 oz (109.3 kg) 236 lb 15.9 oz (107.5 kg) 239 lb 10.2 oz (108.7 kg)    Intake/Output Summary (Last 24 hours) at 11/14/14 1121 Last data filed at 11/14/14 0800  Gross per 24 hour  Intake 162.59 ml  Output   1050 ml  Net -887.41 ml    Telemetry: Sinus rhythm.  Exam:  General: Appears comfortable at rest.  Lungs: Clear, nonlabored.  Cardiac: RRR, no gallop  Abdomen: NABS.  Extremities: No pitting.  Lab Results:  Basic Metabolic Panel:  Recent Labs Lab 11/12/14 0341 11/13/14 0312 11/14/14 0325  NA 143 146* 142  K 3.7 3.4* 3.7  CL 105 103 100  CO2 28 33* 33*  GLUCOSE 173* 45* 92  BUN 11 12 19   CREATININE 1.53* 1.55* 1.79*  CALCIUM 8.9 9.0 8.7    Liver Function Tests:  Recent Labs Lab 11/12/14 0341  AST 26  ALT 26  ALKPHOS 77  BILITOT 0.5  PROT 8.1  ALBUMIN 3.0*    CBC:  Recent Labs Lab 11/12/14 0341 11/13/14 0312 11/14/14 0325  WBC 9.7 8.6 9.9  HGB 13.9 14.5 13.7  HCT 43.1 45.7 43.4  MCV 90.0 90.7 91.0  PLT 363 358 356    Cardiac Enzymes:  Recent Labs Lab 11/12/14 1014 11/12/14 1343 11/12/14 2054  TROPONINI 0.78* 0.63* 0.70*    D-Dimer: 0.62  ECG: Sinus rhythm with LVH and PVCs, repolarization abnormalities noted although cannot exclude ischemic  changes.  Echocardiogram 11/13/2014: Study Conclusions  - Left ventricle: The cavity size was normal. Wall thickness was increased in a pattern of mild LVH. Systolic function was normal. The estimated ejection fraction was in the range of 55% to 60%. There is akinesis of the inferolateral myocardium. Doppler parameters are consistent with abnormal left ventricular relaxation (grade 1 diastolic dysfunction). - Aortic valve: There was trivial regurgitation.  Impressions:  - Akinesis of the basal inferior lateral wall with overall preserved LV function; grade 1 diastolic dysfunction; calcified aortic valve with no AS by doppler; trace AI.   Medications:   Scheduled Medications: . amLODipine  10 mg Oral Daily  . antiseptic oral rinse  7 mL Mouth Rinse BID  . dipyridamole-aspirin  1 capsule Oral BID  . furosemide  40 mg Intravenous BID  . hydrALAZINE  50 mg Oral TID  . insulin aspart  0-9 Units Subcutaneous TID WC  . insulin aspart  3 Units Subcutaneous TID WC  . insulin glargine  40 Units Subcutaneous QHS  . lisinopril  20 mg Oral Daily  . potassium chloride  20 mEq Oral BID  . simvastatin  20 mg Oral q1800    Infusions: . heparin 1,600 Units/hr (11/14/14 16100821)    Assessment:   1. Recent onset worsening shortness of breath, concerning for  ACS with mildly elevated troponin I levels and known history of CAD. ECG shows LVH with repolarization abnormalities, although ischemic changes are not excluded. Troponin I elevation is relatively flat. Echocardiogram shows LVEF 55-60% with inferolateral akinesis consistent with ischemic heart disease. Patient was seen by Dr. Elease Hashimoto with plan to pursue cardiac catheterization on Monday.  2. Mildly elevated d-dimer, pulmonary embolus seems less likely as cause of symptoms. He is on heparin at this time.  3. History of CAD status post previous percutaneous intervention, details are not entirely clear. Records indicate moderate  multivessel disease that was managed medically as of 2010, known occluded obtuse marginal with left to left collaterals and previous LVEF 50-55% as of 2015.  4. Type 2 diabetes mellitus.  5. Essential hypertension.  6. Reported history of atrial flutter, currently in sinus rhythm. He is not anticoagulated.   Plan/Discussion:    Reviewed consultation note and plan per Dr. Elease Hashimoto. Patient more amenable to staying for further testing. On schedule for cardiac catheterization tomorrow - will hold Lasix and gently hydrate, check BMET (creatinine 1.7). Otherwise continue Heparin for now.   Jonelle Sidle, M.D., F.A.C.C.

## 2014-11-14 NOTE — Progress Notes (Addendum)
[place] TEAM 1 - Stepdown/ICU TEAM Progress Note  Brendan Torres HYQ:[id number] DOB: [birth year] DOA: 11/12/2014 PCP: Melida Quitter, MD  Admit HPI / Brief Narrative: 81 y/o M resident of [place], [place] here on a visit to see his daughter in [place] w/ a knwon history of CAD with stent X2 2011, CVA 2012?, HTN, DM, HLD, and morbid obesity who presented to [facility] c/o shortness of breath.  He admitted he had been noncompliant with usual heart healthy diet for 3 days and had forgotten his medications at home in [place].  In the ED his point of care troponin was 0.7.    HPI/Subjective: Patient is resting comfortably.  He denies any complaints at this time.  His nurse informs me that he pulled out his Foley catheter earlier today.  He has been experiencing gross hematuria following this.  Assessment/Plan:  Acute diastolic CHF exacerbation causing acute hypoxic respiratory failure Negative approximately 2.4 L since admission - holding further diuresis due to worsening renal function - follow I's and O's and weights - wean oxygen as able - clinically appears euvolemic - stop ACE inhibitor for now until renal function stabilizes  Elevated troponin w/o chest pain  Peaked at 0.78 - cardiology following and planning for cardiac cath on Monday - continue IV heparin  Mildly elevated d-dimer Currently covered with IV heparin - venous duplex bilateral lower extremities without evidence of DVT - avoid CT angiogram for now and attempt to minimize contrast exposure with upcoming planned cardiac cath  CVA, old, hemiparesis continue Aggrenox - no new deficits appreciated  Diabetes mellitus 2 CBGs well controlled  HTN Blood pressure currently well controlled  CKD (chronic kidney disease) stage 3, GFR 30-59 ml/min Creatinine climbing - agree with holding Lasix as per cardiology  Obesity - Body mass index is 34.38 kg/(m^2).   Hx of OSA noncompliant w/ CPAP   Hematuria Due to  traumatic removal of Foley catheter with ongoing IV heparin - follow clinically and monitor hemoglobin  Code Status: FULL Family Communication: No family present at time of exam today Disposition Plan: SDU  Consultants: [facility] cardiology  Procedures: TTE - 4/17 - mild LVH, ejection fraction 55-60%, akinesis of the inferolateral myocardium, grade 1 diastolic dysfunction  Antibiotics: None  DVT prophylaxis: IV heparin  Objective: Blood pressure 136/75, pulse 68, temperature 98.1 F (36.7 C), temperature source Oral, resp. rate 13, height  (1.778 m), weight 108.7 kg (239 lb 10.2 oz), SpO2 97 %.  Intake/Output Summary (Last 24 hours) at 11/14/14 1638 Last data filed at 11/14/14 1600  Gross per 24 hour  Intake 534.59 ml  Output   1350 ml  Net -815.41 ml   Exam: General: No acute respiratory distress  Lungs: Clear to auscultation bilaterally without wheezes or crackles Cardiovascular: Regular rate and rhythm  Abdomen: Nontender, nondistended, soft, bowel sounds positive Extremities: No significant cyanosis, clubbing;  trace edema bilateral lower extremities  Data Reviewed: Basic Metabolic Panel:  Recent Labs Lab 11/12/14 0341 11/13/14 0312 11/14/14 0325  NA 143 146* 142  K 3.7 3.4* 3.7  CL 105 103 100  CO2 28 33* 33*  GLUCOSE 173* 45* 92  BUN CREATININE 1.53* 1.55* 1.79*  CALCIUM 8.9 9.0 8.7    Liver Function Tests:  Recent Labs Lab 11/12/14 0341  AST 26  ALT 26  ALKPHOS 77  BILITOT 0.5  PROT 8.1  ALBUMIN 3.0*    CBC:  Recent Labs Lab 11/12/14 0341 11/13/14 0312 11/14/14 0325  WBC 9.7 8.6 9.9  HGB 13.9 14.5 13.7  HCT 43.1 45.7 43.4  MCV 90.0 90.7 91.0  PLT 363 358 356    Cardiac Enzymes:  Recent Labs Lab 11/12/14 0403 11/12/14 1014 11/12/14 1343 11/12/14 2054  TROPONINI 0.72* 0.78* 0.63* 0.70*    CBG:  Recent Labs Lab 11/14/14 0009 11/14/14 0451 11/14/14 0735 11/14/14 0837 11/14/14 1151  GLUCAP 154* 77 57*  151* 105*    Recent Results (from the past 240 hour(s))  MRSA PCR Screening     Status: None   Collection Time: 11/12/14  8:26 AM  Result Value Ref Range Status   MRSA by PCR NEGATIVE NEGATIVE Final    Comment:        The GeneXpert MRSA Assay (FDA approved for NASAL specimens only), is one component of a comprehensive MRSA colonization surveillance program. It is not intended to diagnose MRSA infection nor to guide or monitor treatment for MRSA infections.      Studies:   Recent x-ray studies have been reviewed in detail by the Attending Physician  Scheduled Meds:  Scheduled Meds: . amLODipine  10 mg Oral Daily  . antiseptic oral rinse  7 mL Mouth Rinse BID  . dipyridamole-aspirin  1 capsule Oral BID  . hydrALAZINE  50 mg Oral TID  . insulin aspart  0-9 Units Subcutaneous TID WC  . insulin aspart  3 Units Subcutaneous TID WC  . insulin glargine  40 Units Subcutaneous QHS  . lisinopril  20 mg Oral Daily  . potassium chloride  20 mEq Oral BID  . simvastatin  20 mg Oral q1800    Time spent on care of this patient: 25 mins   [facility] Health -[facility] CountyMCCLUNG,JEFFREY T , MD   [facility] Office  [phone] Pager - Text Page per Loretha StaplerAmion as per below:  On-Call/Text Page:      [email or web address]      password [email or web address]  If 7PM-7AM, please contact night-coverage [email or web address] Password [email or web address] 11/14/2014, 4:38 PM   LOS: 2 days

## 2014-11-14 NOTE — Progress Notes (Signed)
VASCULAR LAB PRELIMINARY  PRELIMINARY  PRELIMINARY  PRELIMINARY  Bilateral lower extremity venous Dopplers completed.    Preliminary report:  There is no DVT or SVT noted in the bilateral lower extremities.  Daneli Butkiewicz, RVT 11/14/2014, 11:45 AM

## 2014-11-15 DIAGNOSIS — I249 Acute ischemic heart disease, unspecified: Secondary | ICD-10-CM | POA: Diagnosis present

## 2014-11-15 DIAGNOSIS — R310 Gross hematuria: Secondary | ICD-10-CM

## 2014-11-15 DIAGNOSIS — N179 Acute kidney failure, unspecified: Secondary | ICD-10-CM

## 2014-11-15 DIAGNOSIS — R0600 Dyspnea, unspecified: Secondary | ICD-10-CM

## 2014-11-15 DIAGNOSIS — I5031 Acute diastolic (congestive) heart failure: Secondary | ICD-10-CM

## 2014-11-15 LAB — BASIC METABOLIC PANEL
Anion gap: 8 (ref 5–15)
BUN: 22 mg/dL (ref 6–23)
CHLORIDE: 101 mmol/L (ref 96–112)
CO2: 34 mmol/L — AB (ref 19–32)
CREATININE: 1.92 mg/dL — AB (ref 0.50–1.35)
Calcium: 9 mg/dL (ref 8.4–10.5)
GFR calc non Af Amer: 31 mL/min — ABNORMAL LOW (ref >90)
GFR, EST AFRICAN AMERICAN: 36 mL/min — AB (ref >90)
Glucose, Bld: 149 mg/dL — ABNORMAL HIGH (ref 70–99)
POTASSIUM: 4.1 mmol/L (ref 3.5–5.1)
Sodium: 143 mmol/L (ref 135–145)

## 2014-11-15 LAB — PROTIME-INR
INR: 1.2 (ref 0.00–1.49)
Prothrombin Time: 15.4 seconds — ABNORMAL HIGH (ref 11.6–15.2)

## 2014-11-15 LAB — GLUCOSE, CAPILLARY
GLUCOSE-CAPILLARY: 66 mg/dL — AB (ref 70–99)
GLUCOSE-CAPILLARY: 120 mg/dL — AB (ref 70–99)
Glucose-Capillary: 68 mg/dL — ABNORMAL LOW (ref 70–99)
Glucose-Capillary: 119 mg/dL — ABNORMAL HIGH (ref 70–99)

## 2014-11-15 LAB — CBC
HCT: 41.9 % (ref 39.0–52.0)
Hemoglobin: 13.1 g/dL (ref 13.0–17.0)
MCH: 28.7 pg (ref 26.0–34.0)
MCHC: 31.3 g/dL (ref 30.0–36.0)
MCV: 91.9 fL (ref 78.0–100.0)
Platelets: 345 10*3/uL (ref 150–400)
RBC: 4.56 MIL/uL (ref 4.22–5.81)
RDW: 13.5 % (ref 11.5–15.5)
WBC: 6.6 10*3/uL (ref 4.0–10.5)

## 2014-11-15 LAB — URINALYSIS, ROUTINE W REFLEX MICROSCOPIC
Glucose, UA: NEGATIVE mg/dL
KETONES UR: 40 mg/dL — AB
NITRITE: POSITIVE — AB
Protein, ur: 100 mg/dL — AB
SPECIFIC GRAVITY, URINE: 1.022 (ref 1.005–1.030)
Urobilinogen, UA: 1 mg/dL (ref 0.0–1.0)
pH: 5 (ref 5.0–8.0)

## 2014-11-15 LAB — URINE MICROSCOPIC-ADD ON

## 2014-11-15 LAB — HEPARIN LEVEL (UNFRACTIONATED): Heparin Unfractionated: 0.35 IU/mL (ref 0.30–0.70)

## 2014-11-15 MED ORDER — INSULIN GLARGINE 100 UNIT/ML ~~LOC~~ SOLN
36.0000 [IU] | Freq: Every day | SUBCUTANEOUS | Status: DC
  Administered 2014-11-15: 36 [IU] via SUBCUTANEOUS
  Filled 2014-11-15 (×3): qty 0.36

## 2014-11-15 MED ORDER — SODIUM CHLORIDE 0.9 % IV BOLUS (SEPSIS)
250.0000 mL | Freq: Once | INTRAVENOUS | Status: AC
  Administered 2014-11-15: 250 mL via INTRAVENOUS

## 2014-11-15 MED ORDER — SODIUM CHLORIDE 0.9 % IV SOLN: 250.0000 mL | INTRAVENOUS | Status: DC | PRN

## 2014-11-15 MED ORDER — SODIUM CHLORIDE 0.9 % IV SOLN
INTRAVENOUS | Status: DC
  Administered 2014-11-15: 75 mL/h via INTRAVENOUS
  Administered 2014-11-16: 06:00:00 via INTRAVENOUS

## 2014-11-15 MED ORDER — SODIUM CHLORIDE 0.9 % IJ SOLN: 3.0000 mL | INTRAMUSCULAR | Status: DC | PRN

## 2014-11-15 MED ORDER — SODIUM CHLORIDE 0.9 % IJ SOLN
3.0000 mL | Freq: Two times a day (BID) | INTRAMUSCULAR | Status: DC
  Administered 2014-11-15 – 2014-11-16 (×3): 3 mL via INTRAVENOUS

## 2014-11-15 MED ORDER — ISOSORBIDE MONONITRATE ER 30 MG PO TB24
30.0000 mg | ORAL_TABLET | Freq: Every day | ORAL | Status: DC
  Administered 2014-11-15 – 2014-11-17 (×3): 30 mg via ORAL
  Filled 2014-11-15 (×3): qty 1

## 2014-11-15 NOTE — Progress Notes (Addendum)
Inpatient Diabetes Program Recommendations  AACE/ADA: New Consensus Statement on Inpatient Glycemic Control (2013)  Target Ranges:  Prepandial:   less than 140 mg/dL      Peak postprandial:   less than 180 mg/dL (1-2 hours)      Critically ill patients:  140 - 180 mg/dL   Results for Brendan Torres, Brendan Torres (MRN [id number]) as of 11/15/2014 10:08  Ref. Range 11/14/2014 07:35 11/14/2014 08:37 11/14/2014 11:51 11/14/2014 15:54 11/14/2014 20:04 11/15/2014 07:51 11/15/2014 08:47  Glucose-Capillary Latest Ref Range: 70-99 mg/dL 57 (L) 846151 (H) 962105 (H) 131 (H) 171 (H) 66 (L) 68 (L)    Diabetes history: DM 2 Outpatient Diabetes medications: Lantus 74 units QHS Current orders for Inpatient glycemic control: Lantus 40 units QHS  Inpatient Diabetes Program Recommendations Insulin - Basal: Patient has been hypoglycemic the past few mornings. Please reduce basal insulin to Lantus 35 units QHS. Patient also having lows due to worsening renal function.  Thanks,  Christena DeemShannon Taveon Enyeart RN, MSN, [facility] Inpatient Diabetes Coordinator Team Pager [phone]

## 2014-11-15 NOTE — Progress Notes (Signed)
Subjective:  No chest pain; wanting to go home  Objective:   Vital Signs : Filed Vitals:   11/15/14 0338 11/15/14 0400 11/15/14 0500 11/15/14 0753  BP:  99/59  107/77  Pulse:      Temp: 98.5 F (36.9 C)   98.3 F (36.8 C)  TempSrc: Oral   Oral  Resp:  16    Height:      Weight:   236 lb 8.9 oz (107.3 kg)   SpO2:  99%      Intake/Output from previous day:  Intake/Output Summary (Last 24 hours) at 11/15/14 0801 Last data filed at 11/15/14 0600  Gross per 24 hour  Intake 1112.25 ml  Output    875 ml  Net 237.25 ml    I/O since admission: -2332  Wt Readings from Last 3 Encounters:  11/15/14 236 lb 8.9 oz (107.3 kg)    Medications: . amLODipine  10 mg Oral Daily  . antiseptic oral rinse  7 mL Mouth Rinse BID  . dipyridamole-aspirin  1 capsule Oral BID  . hydrALAZINE  50 mg Oral TID  . insulin aspart  0-9 Units Subcutaneous TID WC  . insulin aspart  3 Units Subcutaneous TID WC  . insulin glargine  40 Units Subcutaneous QHS  . potassium chloride  20 mEq Oral BID  . simvastatin  20 mg Oral q1800  . sodium chloride  3 mL Intravenous Q12H    . sodium chloride 75 mL/hr at 11/15/14 0339  . heparin 1,600 Units/hr (11/14/14 2354)    Physical Exam:   General appearance: alert, cooperative and no distress Neck: no adenopathy, no JVD, supple, symmetrical, trachea midline and thyroid not enlarged, symmetric, no tenderness/mass/nodules Lungs: decreased BS; no wheezing Heart: regular rate and rhythm and 1/6 sem; no s3, rub Abdomen: soft, non-tender; bowel sounds normal; no masses,  no organomegaly Extremities: no edema, redness or tenderness in the calves or thighs Skin: Skin color, texture, turgor normal. No rashes or lesions  Neuro: grossly nonfocal   Rate: 68  Rhythm: normal sinus rhythm  ECG (independently read by me): NSR at 75; LVH;  ST changes V3-6  Lab Results:  BMP Latest Ref Rng 11/15/2014 11/14/2014 11/13/2014  Glucose 70 - 99 mg/dL 161(W149(H) 92 96(E45(L)    BUN 6 - 23 mg/dL 22 19 12   Creatinine 0.50 - 1.35 mg/dL 4.54(U1.92(H) 9.81(X1.79(H) 9.14(N1.55(H)  Sodium 135 - 145 mmol/L 143 142 146(H)  Potassium 3.5 - 5.1 mmol/L 4.1 3.7 3.4(L)  Chloride 96 - 112 mmol/L 101 100 103  CO2 19 - 32 mmol/L 34(H) 33(H) 33(H)  Calcium 8.4 - 10.5 mg/dL 9.0 8.7 9.0     CBC Latest Ref Rng 11/15/2014 11/14/2014 11/13/2014  WBC 4.0 - 10.5 K/uL 6.6 9.9 8.6  Hemoglobin 13.0 - 17.0 g/dL 82.913.1 56.213.7 13.014.5  Hematocrit 39.0 - 52.0 % 41.9 43.4 45.7  Platelets 150 - 400 K/uL 345 356 358      Recent Labs  11/12/14 1343 11/12/14 2054  TROPONINI 0.63* 0.70*    Hepatic Function Panel No results for input(s): PROT, ALBUMIN, AST, ALT, ALKPHOS, BILITOT, BILIDIR, IBILI in the last 72 hours.  Recent Labs  11/15/14 0240  INR 1.20   BNP (last 3 results)  Recent Labs  11/12/14 1014 11/12/14 1340  BNP 494.4* 469.1*    ProBNP (last 3 results) No results for input(s): PROBNP in the last 8760 hours.   Lipid Panel  No results found for: CHOL, TRIG, HDL, CHOLHDL, VLDL, LDLCALC, LDLDIRECT  Imaging:  Echocardiogram 11/13/2014: Study Conclusions  - Left ventricle: The cavity size was normal. Wall thickness was increased in a pattern of mild LVH. Systolic function was normal. The estimated ejection fraction was in the range of 55% to 60%. There is akinesis of the inferolateral myocardium. Doppler parameters are consistent with abnormal left ventricular relaxation (grade 1 diastolic dysfunction). - Aortic valve: There was trivial regurgitation.  Impressions:  - Akinesis of the basal inferior lateral wall with overall preserved LV function; grade 1 diastolic dysfunction; calcified aortic valve with no AS by doppler; trace AI.   Assessment/Plan:   Principal Problem:   Acute heart failure, systolic Active Problems:   Elevated troponin   CVA, old, hemiparesis   Diabetes mellitus ty 2   HTN (hypertension)   Obesity (BMI 30.0-34.9)   Former smoker    Tremor   Dyspnea   Hematuria, gross  1. NSTEMI 2. CAD: Known disease with occluded OM with collateral at 2010 cath; akinesis of inferolateral wall on echo. 3. CKD: Cr increased to 1.92 today 4. DM2 5. Obesity 6. Old CVA  Will cancel planned cath today with worsening renal fxn. Hydrate. Will add nitrates to his hydralazine. LVH with repolarization but with possible ischemic changes. Will tentatively set for cath tomorrow if renal fxn improves. If worsens may need to defer during this admission. Will f/u U/A, and labs in am.  Lennette Bihari, MD, [facility] 11/15/2014, 8:01 AM

## 2014-11-15 NOTE — Progress Notes (Signed)
Bladder scan done only 25 cc noted. Still wearing condom cath noted bloody urine.

## 2014-11-15 NOTE — Progress Notes (Signed)
Hypoglycemic Event  CBG: 66  Treatment: 15 GM carbohydrate snack  Symptoms: None  Follow-up CBG: Time:0800       CBG Result:68  Possible Reasons for Event: Inadequate meal intake  Comments/MD notified:McClung    Marissa NestleHood, Makenley Shimp Ann  Remember to initiate Hypoglycemia Order Set & completeAdult (Non-Pregnant) Hypoglycemia Protocol Treatment Guidelines  1.  RN shall initiate Hypoglycemia Protocol emergency measures immediately when:            w Routine or STAT CBG and/or a lab glucose indicates hypoglycemia (CBG < 70 mg/dl)  2.  Treat the patient according to ability to take PO's and severity of hypoglycemia.   3.  If patient is on GlucoStabilizer, follow directions provided by the [facility] for hypoglycemic events.  4.  If patient on insulin pump, follow Hypoglycemia Protocol.  If patient requires more than one treatment have patient place pump in SUSPEND and notify MD.  DO NOT leave pump in SUSPEND for greater than 30 minutes unless ordered by MD.  A.  Treatment for Mild or Moderate-Patient cooperative and able to swallow    1.  Patient taking PO's and can cooperate   a.  Give one of the following 15 gram CHO options:                           w 1 tube oral dextrose gel                           w 3-4 Glucose tablets                           w 4 oz. Juice                           w 4 oz. regular soda                                    ESRD patients:  clear, regular soda                           w 8 oz. skim milk    b.  Recheck CBG in 15 minutes after treatment                            w If CBG < 70 mg/dl, repeat treatment and recheck until hypoglycemia is resolved                            w If CBG > 70 mg/dl and next meal is more than 1 hour away, give additional 15 grams CHO   2.  Patient NPO-Patient cooperative and no altered mental status    a.  Give 25 ml of D50 IV.   b.  Recheck CBG in 15 minutes after treatment.                             w If CBG is  less than 70 mg/dl, repeat treatment and recheck until hypoglycemia is resolved.   c.  Notify MD for further orders.  SPECIAL CONSIDERATIONS:    a.  If no IV access,                              w Start IV of D5W at [facility]                             w Give 25 ml of D50 IV.    b.  If unable to gain IV access                             w Give Glucagon IM:    i.  1 mg if patient weighs more than 45.5 kg    ii.  0.5 mg if patient weighs less than 45.5 kg   c.  Notify MD for further orders  B.  Treatment for Severe-- Patient unconscious or unable to take PO's safely    1.  Position patient on side   2.  Give 50 ml D50 IV   3.  Recheck CBG in 15 minutes.                    w If CBG is less than 70 mg/dl, repeat treatment and recheck until hypoglycemia is resolved.   4.  Notify MD for further orders.    SPECIAL CONSIDERATIONS:    a.  If no IV access                              w Give Glucagon IM                                        i.  1 mg if patient weighs more than 45.5 kg                                       ii.  0.5 mg if patient weighs less than 45.5 kg                              w Start IV of D5W at 50 ml/hr and give 50 ml D50 IV   b.  If no IV access and active seizure                               w Call Rapid Response   c.  If unable to gain IV access, give Glucagon IM:                              w 1 mg if patient weighs more than 45.5 kg                              w 0.5 mg if patient weighs less than 45.5 kg   d.  Notify MD for further orders.  C.  Complete smart text progress note to document intervention and follow-up CBG   1.  In  [facility] patient chart, click on Notes (left side of screen)   2.  Create Progress Note   3.  Click on [employer].  In the Match box type "hypo" and enter    4.  Double click on [facility] IP HYPOGLYCEMIC EVENT and enter data   5.  MD must be notified if patient is NPO or experienced severe hypoglycemia

## 2014-11-15 NOTE — Progress Notes (Signed)
ANTICOAGULATION CONSULT NOTE - Follow Up Consult  Pharmacy Consult for Heparin  Indication: chest pain/ACS  No Known Allergies  Patient Measurements: Height: 5\' 10"  (177.8 cm) Weight: 236 lb 8.9 oz (107.3 kg) IBW/kg (Calculated) : 73  Vital Signs: Temp: 98.3 F (36.8 C) (04/18 0753) Temp Source: Oral (04/18 0753) BP: 123/65 mmHg (04/18 1027)  Labs:  Recent Labs  11/12/14 1343 11/12/14 2054  11/13/14 0312  11/14/14 0325 11/14/14 1315 11/15/14 0240  HGB  --   --   < > 14.5  --  13.7  --  13.1  HCT  --   --   --  45.7  --  43.4  --  41.9  PLT  --   --   --  358  --  356  --  345  LABPROT  --   --   --   --   --   --   --  15.4*  INR  --   --   --   --   --   --   --  1.20  HEPARINUNFRC  --   --   < >  --   < > 0.35 0.51 0.35  CREATININE  --   --   --  1.55*  --  1.79*  --  1.92*  TROPONINI 0.63* 0.70*  --   --   --   --   --   --   < > = values in this interval not displayed.  Estimated Creatinine Clearance: 37.6 mL/min (by C-G formula based on Cr of 1.92).   . sodium chloride 75 mL/hr at 11/15/14 0339  . sodium chloride    . heparin 1,600 Units/hr (11/14/14 2354)    Assessment: 81 yo male on IV heparin for r/o ACS.  Heparin level this AM therapeutic on heparin at 1600 units/hr.  No bleeding or complications noted.  Cath for today cancelled due to worsening renal function, hopefully can do tomorrow.  Goal of Therapy:  Heparin level 0.3-0.7 units/ml Monitor platelets by anticoagulation protocol: Yes   Plan:  -Continue heparin 1600 units/hr. -Daily CBC/HL -Monitor for bleeding  Christoper Fabianaron Ashely Joshua, PharmD, BCPS Clinical pharmacist, pager [phone]  11/15/2014 11:45 AM

## 2014-11-15 NOTE — Progress Notes (Signed)
TEAM 1 - Stepdown/ICU TEAM Progress Note  Howell PringleSanders Harting ZOX:[id number] DOB: Dec 25, 1931 DOA: 11/12/2014 PCP: Melida QuitterSIDHU,AMANJOT, MD  Admit HPI / Brief Narrative: 81 y/o M resident of [place], [place] here on a visit to see his [place] in [place] w/ a knwon history of CAD with stent X2 2011, CVA 2012?, HTN, DM, HLD, and morbid obesity who [facility] c/o shortness of breath.  He admitted he had been noncompliant with usual heart healthy diet for 3 days and had forgotten his medications at home in [place].  In the ED his point of care troponin was 0.7.    HPI/Subjective: The patient is resting comfortably and is cooperative today.  He denies any complaints at this point and again states "I just want to go home."  He denies any chest pain fevers chills nausea vomiting or urinary retention at present.  He also denies dysuria.  His creatinine has increased today, and his hematuria persists.  Assessment/Plan:  Acute on CKD (stage 3, GFR 30-59 ml/min at baseline) Creatinine still climbing - continue to hold diuretic and ACE inhibitor - agree with postponing cardiac cath - fluid challenge - check bladder scan to ensure patient not retaining due to hematuria/clot  Acute diastolic CHF exacerbation causing acute hypoxic respiratory failure Negative approximately 2.0 L since admission - holding further diuresis due to worsening renal function - follow I's and O's and weights - wean oxygen as able - clinically appears euvolemic - stopped ACE inhibitor for now until renal function stabilizes - baseline weight unclear as patient does not live in [place]  Elevated troponin w/o chest pain  Peaked at 0.78 - Cardiology following and has now postponed cath until tomorrow due to renal function - continue IV heparin  Mildly elevated d-dimer Currently covered with IV heparin - venous duplex bilateral lower extremities without evidence of DVT - avoid CT angiogram for now and  attempt to minimize contrast exposure with upcoming planned cardiac cath  CVA, old, hemiparesis continue Aggrenox - no new deficits appreciated  Diabetes mellitus 2 CBGs well controlled with some episodes of hypoglycemia likely due to poor intake and prolonged insulin retention due to renal failure - adjust treatment and follow  HTN Blood pressure currently well controlled  Obesity - Body mass index is 33.94 kg/(m^2).   Hx of OSA noncompliant w/ CPAP   Hematuria Due to traumatic removal of Foley catheter per patient with ongoing IV heparin - follow clinically and monitor hemoglobin - bladder scan today to assure no evidence of urinary retention  Code Status: FULL Family Communication: Discussed plan of care at length with patient and wife at bedside Disposition Plan: SDU  Consultants: [facility] cardiology  Procedures: TTE - 4/17 - mild LVH, ejection fraction 55-60%, akinesis of the inferolateral myocardium, grade 1 diastolic dysfunction  Antibiotics: None  DVT prophylaxis: IV heparin  Objective: Blood pressure 122/82, pulse 61, temperature 97.7 F (36.5 C), temperature source Oral, resp. rate 11, height 5\' 10"  (1.778 m), weight 107.3 kg (236 lb 8.9 oz), SpO2 92 %.  Intake/Output Summary (Last 24 hours) at 11/15/14 1528 Last data filed at 11/15/14 0957  Gross per 24 hour  Intake 990.25 ml  Output    575 ml  Net 415.25 ml   Exam: General: No acute respiratory distress  Lungs: Clear to auscultation bilaterally without wheezes or crackles Cardiovascular: Regular rate and rhythm - no appreciable gallop rub or murmur Abdomen: Nontender, nondistended, soft, bowel sounds positive Extremities: No significant cyanosis, clubbing;  trace edema bilateral lower extremities GU:  Condom catheter in place with blood-tinged urine and collection bag  Data Reviewed: Basic Metabolic Panel:  Recent Labs Lab 11/12/14 0341 11/13/14 0312 11/14/14 0325 11/15/14 0240  NA 143 146* 142  143  K 3.7 3.4* 3.7 4.1  CL 105 103 100 101  CO2 28 33* 33* 34*  GLUCOSE 173* 45* 92 149*  BUN CREATININE 1.53* 1.55* 1.79* 1.92*  CALCIUM 8.9 9.0 8.7 9.0    Liver Function Tests:  Recent Labs Lab 11/12/14 0341  AST 26  ALT 26  ALKPHOS 77  BILITOT 0.5  PROT 8.1  ALBUMIN 3.0*    CBC:  Recent Labs Lab 11/12/14 0341 11/13/14 0312 11/14/14 0325 11/15/14 0240  WBC 9.7 8.6 9.9 6.6  HGB 13.9 14.5 13.7 13.1  HCT 43.1 45.7 43.4 41.9  MCV 90.0 90.7 91.0 91.9  PLT 363 358 356 345    Cardiac Enzymes:  Recent Labs Lab 11/12/14 0403 11/12/14 1014 11/12/14 1343 11/12/14 2054  TROPONINI 0.72* 0.78* 0.63* 0.70*    CBG:  Recent Labs Lab 11/14/14 1554 11/14/14 2004 11/15/14 0751 11/15/14 0847 11/15/14 1154  GLUCAP 131* 171* 66* 68* 119*    Recent Results (from the past 240 hour(s))  MRSA PCR Screening     Status: None   Collection Time: 11/12/14  8:26 AM  Result Value Ref Range Status   MRSA by PCR NEGATIVE NEGATIVE Final    Comment:        The GeneXpert MRSA Assay (FDA approved for NASAL specimens only), is one component of a comprehensive MRSA colonization surveillance program. It is not intended to diagnose MRSA infection nor to guide or monitor treatment for MRSA infections.      Studies:   Recent x-ray studies have been reviewed in detail by the Attending Physician  Scheduled Meds:  Scheduled Meds: . amLODipine  10 mg Oral Daily  . antiseptic oral rinse  7 mL Mouth Rinse BID  . dipyridamole-aspirin  1 capsule Oral BID  . hydrALAZINE  50 mg Oral TID  . insulin aspart  0-9 Units Subcutaneous TID WC  . insulin aspart  3 Units Subcutaneous TID WC  . insulin glargine  40 Units Subcutaneous QHS  . isosorbide mononitrate  30 mg Oral Daily  . potassium chloride  20 mEq Oral BID  . simvastatin  20 mg Oral q1800  . sodium chloride  3 mL Intravenous Q12H    Time spent on care of this patient: 35 mins   Eissa Buchberger T , MD    [facility] Office  [phone] Pager - Text Page per Loretha Stapler as per below:  On-Call/Text Page:      [email or web address]      password [email or web address]  If 7PM-7AM, please contact night-coverage [email or web address] Password [email or web address] 11/15/2014, 3:28 PM   LOS: 3 days

## 2014-11-15 NOTE — Progress Notes (Signed)
Pt chewing and spitting [place] tobacco. Advised pt That is not advised at this time with cardiac HX and pending heart cath. Pt says he doesn't care been chewing for a long time. Wife at bedside.

## 2014-11-15 NOTE — Clinical Documentation Improvement (Signed)
MD's, NP's, and PA's  Documentation of "NSTEMI" please clarify documentation as to which condition was ruled out/ruled in if appropriate for this admission.  Thank you   Possible Conditions  NSTEMI ruled out  NSTEMI ruled in  Other Condition  Not able to determine   Risk Factors: h/o MI, Acute on Chronic Systolic and Diastolic HF, HTN, CVA, CKD stg 3  Sign & Symptoms:Elevated Troponin levels  Diagnostics: Troponin level x 3  Treatment: cardiac monitoring  Thank you,  Lavonda JumboLawanda J Maggie Dworkin ,RN Clinical Documentation Specialist:  [phone]  [facility]- Health Information Management

## 2014-11-16 ENCOUNTER — Encounter (HOSPITAL_COMMUNITY): Payer: Self-pay | Admitting: Cardiology

## 2014-11-16 ENCOUNTER — Encounter (HOSPITAL_COMMUNITY)
Admission: EM | Disposition: A | Discharge status: Home / Self Care | Payer: Self-pay | Source: Home / Self Care | Attending: Internal Medicine

## 2014-11-16 DIAGNOSIS — I2510 Atherosclerotic heart disease of native coronary artery without angina pectoris: Secondary | ICD-10-CM

## 2014-11-16 DIAGNOSIS — Z9861 Coronary angioplasty status: Secondary | ICD-10-CM

## 2014-11-16 DIAGNOSIS — I249 Acute ischemic heart disease, unspecified: Secondary | ICD-10-CM

## 2014-11-16 HISTORY — PX: LEFT HEART CATHETERIZATION WITH CORONARY ANGIOGRAM: SHX5451

## 2014-11-16 HISTORY — PX: PERCUTANEOUS CORONARY STENT INTERVENTION (PCI-S): SHX5485

## 2014-11-16 LAB — GLUCOSE, CAPILLARY
GLUCOSE-CAPILLARY: 201 mg/dL — AB (ref 70–99)
GLUCOSE-CAPILLARY: 47 mg/dL — AB (ref 70–99)
GLUCOSE-CAPILLARY: 71 mg/dL (ref 70–99)
GLUCOSE-CAPILLARY: 52 mg/dL — AB (ref 70–99)
GLUCOSE-CAPILLARY: 130 mg/dL — AB (ref 70–99)
Glucose-Capillary: 63 mg/dL — ABNORMAL LOW (ref 70–99)
Glucose-Capillary: 81 mg/dL (ref 70–99)
Glucose-Capillary: 88 mg/dL (ref 70–99)
Glucose-Capillary: 63 mg/dL — ABNORMAL LOW (ref 70–99)
Glucose-Capillary: 95 mg/dL (ref 70–99)

## 2014-11-16 LAB — CBC
HCT: 39.6 % (ref 39.0–52.0)
Hemoglobin: 12.3 g/dL — ABNORMAL LOW (ref 13.0–17.0)
MCH: 28.5 pg (ref 26.0–34.0)
MCHC: 31.1 g/dL (ref 30.0–36.0)
MCV: 91.9 fL (ref 78.0–100.0)
PLATELETS: 323 10*3/uL (ref 150–400)
RBC: 4.31 MIL/uL (ref 4.22–5.81)
RDW: 13.4 % (ref 11.5–15.5)
WBC: 7 10*3/uL (ref 4.0–10.5)

## 2014-11-16 LAB — BASIC METABOLIC PANEL
Anion gap: 10 (ref 5–15)
BUN: 21 mg/dL (ref 6–23)
CALCIUM: 8.9 mg/dL (ref 8.4–10.5)
CO2: 28 mmol/L (ref 19–32)
CREATININE: 1.69 mg/dL — AB (ref 0.50–1.35)
Chloride: 105 mmol/L (ref 96–112)
GFR calc Af Amer: 42 mL/min — ABNORMAL LOW (ref >90)
GFR calc non Af Amer: 37 mL/min — ABNORMAL LOW (ref >90)
GLUCOSE: 78 mg/dL (ref 70–99)
Potassium: 4.3 mmol/L (ref 3.5–5.1)
Sodium: 143 mmol/L (ref 135–145)

## 2014-11-16 LAB — POCT ACTIVATED CLOTTING TIME
ACTIVATED CLOTTING TIME: 294 s
Activated Clotting Time: 239 seconds
Activated Clotting Time: 288 seconds

## 2014-11-16 LAB — LIPID PANEL
CHOLESTEROL: 91 mg/dL (ref 0–200)
HDL: 28 mg/dL — AB (ref >39)
LDL CALC: 53 mg/dL (ref 0–99)
TRIGLYCERIDES: 51 mg/dL (ref <150)
Total CHOL/HDL Ratio: 3.3 RATIO
VLDL: 10 mg/dL (ref 0–40)

## 2014-11-16 LAB — HEPARIN LEVEL (UNFRACTIONATED): Heparin Unfractionated: 0.37 IU/mL (ref 0.30–0.70)

## 2014-11-16 SURGERY — LEFT HEART CATHETERIZATION WITH CORONARY ANGIOGRAM
Anesthesia: LOCAL

## 2014-11-16 MED ORDER — DEXTROSE 50 % IV SOLN
INTRAVENOUS | Status: AC
Start: 2014-11-16 — End: 2014-11-16
  Administered 2014-11-16: 25 mL
  Filled 2014-11-16: qty 50

## 2014-11-16 MED ORDER — CLOPIDOGREL BISULFATE 75 MG PO TABS
75.0000 mg | ORAL_TABLET | Freq: Every day | ORAL | Status: DC
  Administered 2014-11-17 – 2014-11-18 (×2): 75 mg via ORAL
  Filled 2014-11-16 (×2): qty 1

## 2014-11-16 MED ORDER — DOCUSATE SODIUM 50 MG/5ML PO LIQD
50.0000 mg | Freq: Once | ORAL | Status: DC
  Filled 2014-11-16: qty 10

## 2014-11-16 MED ORDER — FAMOTIDINE IN NACL 20-0.9 MG/50ML-% IV SOLN
INTRAVENOUS | Status: AC
  Filled 2014-11-16: qty 50

## 2014-11-16 MED ORDER — CLOPIDOGREL BISULFATE 300 MG PO TABS
ORAL_TABLET | ORAL | Status: AC
  Filled 2014-11-16: qty 1

## 2014-11-16 MED ORDER — SODIUM CHLORIDE 0.9 % IV SOLN
INTRAVENOUS | Status: DC
Start: 2014-11-16 — End: 2014-11-16
  Administered 2014-11-16: 13:00:00 via INTRAVENOUS

## 2014-11-16 MED ORDER — ADENOSINE 12 MG/4ML IV SOLN
16.0000 mL | Freq: Once | INTRAVENOUS | Status: DC
  Filled 2014-11-16: qty 16

## 2014-11-16 MED ORDER — SODIUM CHLORIDE 0.9 % IJ SOLN: 3.0000 mL | INTRAMUSCULAR | Status: DC | PRN

## 2014-11-16 MED ORDER — MORPHINE SULFATE 2 MG/ML IJ SOLN: 2.0000 mg | INTRAMUSCULAR | Status: DC | PRN

## 2014-11-16 MED ORDER — HEPARIN SODIUM (PORCINE) 1000 UNIT/ML IJ SOLN
INTRAMUSCULAR | Status: AC
  Filled 2014-11-16: qty 1

## 2014-11-16 MED ORDER — MIDAZOLAM HCL 2 MG/2ML IJ SOLN
INTRAMUSCULAR | Status: AC
  Filled 2014-11-16: qty 2

## 2014-11-16 MED ORDER — HEPARIN (PORCINE) IN NACL 2-0.9 UNIT/ML-% IJ SOLN
INTRAMUSCULAR | Status: AC
  Filled 2014-11-16: qty 1000

## 2014-11-16 MED ORDER — SODIUM CHLORIDE 0.9 % IV SOLN: 250.0000 mL | INTRAVENOUS | Status: DC | PRN

## 2014-11-16 MED ORDER — FENTANYL CITRATE (PF) 100 MCG/2ML IJ SOLN
INTRAMUSCULAR | Status: AC
  Filled 2014-11-16: qty 2

## 2014-11-16 MED ORDER — SODIUM CHLORIDE 0.9 % IV SOLN: INTRAVENOUS | Status: DC

## 2014-11-16 MED ORDER — DEXTROSE 50 % IV SOLN
INTRAVENOUS | Status: AC
  Filled 2014-11-16: qty 50

## 2014-11-16 MED ORDER — VERAPAMIL HCL 2.5 MG/ML IV SOLN
INTRAVENOUS | Status: AC
  Filled 2014-11-16: qty 2

## 2014-11-16 MED ORDER — DEXTROSE 50 % IV SOLN
25.0000 mL | Freq: Once | INTRAVENOUS | Status: AC
  Administered 2014-11-16: 25 mL via INTRAVENOUS

## 2014-11-16 MED ORDER — LIDOCAINE HCL (PF) 1 % IJ SOLN
INTRAMUSCULAR | Status: AC
  Filled 2014-11-16: qty 30

## 2014-11-16 MED ORDER — SODIUM CHLORIDE 0.9 % IJ SOLN: 3.0000 mL | Freq: Two times a day (BID) | INTRAMUSCULAR | Status: DC

## 2014-11-16 NOTE — Progress Notes (Signed)
To 6c with tele, stable

## 2014-11-16 NOTE — Progress Notes (Signed)
Pt transferred to Cath Lab.  Vital signs stable and no s/s of acute distress.  CBG 52 and 25ml D50 administered.  Alfredos, RRT, in the Cath Lab notified of CBG and need for follow up CBG in 15 minutes.  Daughter, Pam, called and notified of pt transfer to Cath Lab.

## 2014-11-16 NOTE — CV Procedure (Addendum)
CARDIAC CATHETERIZATION AND PERCUTANEOUS CORONARY INTERVENTION REPORT  NAME:  Brendan Torres   MRN: [id number] DOB:  1932/06/21   ADMIT DATE: 11/12/2014 Procedure Date: 11/16/2014  INTERVENTIONAL CARDIOLOGIST: Marykay Lex, M.D., MS PRIMARY CARE PROVIDER: Melida Quitter, MD PRIMARY CARDIOLOGIST:  New to [facility]  PATIENT:  Brendan Torres is a 81 y.o. male (followed by [facility] in [place]) with known CAD - MI x 2 (reportedly PCI x 2).  Last Cath for Abnormal Nuclear ST in 2010 (inferolateral ischemia) revealed 90% small RI, 100 % OM2 with L-L collaterals, moderate LAD disease with FFR 0.92.   He was visiting family in [place]. Admitted to [facility] on 4/15 with SOB x 3 days & ruled in with + Troponin levels ~0.7 - but flat trajectory).  Referred for diagnostic cardiac cath, deferred from yesterday due to worsening renal function.  Improved today after hydration - Cr 1.69.  PRE-OPERATIVE DIAGNOSIS:    NSTEMI  Known CAD  PROCEDURES PERFORMED:    Left Heart Catheterization with Native Coronary Angiography  via Right Radial Artery   Left Ventriculography  PROCEDURE: The patient was brought to the 2nd Floor [facility] in the fasting state and prepped and draped in the usual sterile fashion for Right Radial artery access. A modified Allen's test was performed on the Right wrist demonstrating excellent collateral flow for radial access.   Sterile technique was used including antiseptics, cap, gloves, gown, hand hygiene, mask and sheet. Skin prep: Chlorhexidine.   Consent: Risks of procedure as well as the alternatives and risks of each were explained to the (patient/caregiver). Consent for procedure obtained.   Time Out: Verified patient identification, verified procedure, site/side was marked, verified correct patient position, special equipment/implants available, medications/allergies/relevent history reviewed, required imaging and test results  available. Performed.  Access:   Right Radial Artery: 6 Fr Sheath -  Seldinger Technique (Angiocath Micropuncture Kit)  Radial Cocktail - 10 mL; IV Heparin 5500 Units   Left Heart Catheterization: 5Fr Catheters advanced or exchanged over a long-exchange safety J-wire under fluoroscopic guidance; TIG 4.0 catheter advanced first.  Left & Right Coronary Artery Cineangiography: TIG 4.0 Catheter   LV Hemodynamics: TIG 4.0 Catheter  FINDINGS:  Hemodynamics:   Central Aortic Pressure / Mean: 116/62/90 mmHg  Left Ventricular Pressure / LVEDP: 117/7/22 mmHg  Left Ventriculography: Deferred  Coronary Anatomy: Very large, ectatic vessels.  Dominance: Right  Left Main: Large caliber.  Trifurcates into LAD, Circumflex & small Ramus Intermedius  LAD: Very Large caliber vessel with diffuse mild disease proximally before D1.  The "ostial segment" after D1 takeoff has a focal ~70-80% stenosis of questionable significance (prior FFR was 0.92).  The LAD then normalizes and has a ~40-50% distal tubular lesion before it wraps the apex to the distal inferoapex.    D1: large caliber branch that bifurcates in the mid vessel into 2 moderate caliber branches.  Mild luminal irregularities.  Left Circumflex: Large caliber vessel with ~promximal-mid 100% (apparently) chronic total occlusion.  L-R collaterals from D2 fill 2 OM branches retrograde.  Ramus intermedius: small caliber vessel with ~90% proximal lesion (previously described).   RCA: Large caliber, dominant vessel that bifurcates distally into the Right Posterior Descending Artery and the  Right Posterior AV Groove Branch (RPAV).  There is a focal ~95% stenosis at the first bend in the proximal segment.  The mid vessel is very ectatic until it bifurcates.  Mild disease distally.  RPDA: Moderate caliber vessel with diffuse mild-moderate disease.  RPL  Sysytem:The RPAV begins as a large caliber vessel that gives off 2 RPL branches.  RPL1 has ostial  ~60% stenosis & RPL is 100% occluded just after its initial bend & fills retrograde from R-L collaterals.  After reviewing the initial angiography, the culprit lesion was thought to be the proximal RCA 95% lesion, as the 100% Circumflex appears to be chronically occluded.  The mid LAD ~70-80% lesion in a very large vessel also seemed concerning.  Preparation were made to proceed with FFR on the LAD lesion in order to decide for CABG vs PCI on the RCA lesion.  Percutaneous Coronary Intervention:  Additional IV Heparin 3000 Units administered; ACT ~290 Sec   Lesion #1: mid LAD ~70-80%  Guide: XBLAD 3.5 ; Guidewire: Prowater  FRACTIONAL FLOW RESERVE MEASUREMENT  Acist FFR catheter zeroed  Prowater wire advanced to distal LAD  FFR Catheter advanced & equalized in LM then passed beyond the lesion in mid LAD  IV Adenosine infused x 2.5 min -- preFFR 0.98, Post- FFR 0.92 (Not physiologically significant) --   Therefore, the decision was made to proceed with RCA PCI.   Lesion #2: prox RCA 95% lesion (TIMI 3 flow) -- reduced to 0% (TIMI 3 flow0 Guide: 6 Fr   JR4 (for initial balloon), AR2 for Stent  Guidewire: Prowater (Luge wire used for buddy wire - unsuccessful balloon advance Predilation Balloon: Euphora 2.5 mm x 12 mm;   8 Atm x 30 Sec,   For Guideliner advancement 4 Atm x 20 Sec  Unable to advance 5.0 mm stent around proximal bend.  Buddy wire used - still unsuccessful. JR4 Guide exchanged for AR2 Guide for better support. Guideliner advanced over pre-dilation balloon beyond the lesion & stent successfully passed beyond the lesion.  Stent: Ultra BMS 4.5 mm x 18 mm; (5.0 mm stent would not advance through the GuideLiner)  18 Atm x 30 Sec,   Post-dilated with stent balloon 20 Atm x 30 Sec Post-dilation Balloon: [place] 5.0 mm x 12 mm;   14 Atm x 30 Sec; Final Diameter - 5.1 mm  Post deployment angiography in multiple views, with and without guidewire in place revealed adequate  stent deployment and lesion coverage.  There was no evidence of dissection or perforation.  Complex / Difficult PCI procedure due to use of Guideliner & buddy wire as well as Guide Catheter exchange.  Catheter removed completely out of the body over a wire. Sheath removed in the CARDIAC CATH LAB with TR Band placement for hemostasis.  TR Band: 1800  Hours; 15 mL air  MEDICATIONS:  Anesthesia:  Local Lidocaine 2 ml  Sedation:  2 mg IV Versed, 50 mcg IV fentanyl ;   Omnipaque Contrast: 200 ml  Anticoagulation:  IV Heparin 5500 Units ; Additional 3000 & 2000 Unit boluses (ACT> 280 Sec)  Anti-Platelet Agent:  Plavix 600 mg  200 mcg IC NTG Radial Cocktail: 5 mg Verapamil, 400 mcg NTG, 2 ml 2% Lidocaine in 10 ml NS 12/ Amp Glucose (for CBG 63) Adenosine 140 mcg/kg/min x 2.5 min   PATIENT DISPOSITION:    The patient was transferred to the PACU holding area in a hemodynamicaly stable, chest pain free condition.  The patient tolerated the procedure well, and there were no complications.  EBL:   < 2 ml  The patient was stable before, during, and after the procedure.  POST-OPERATIVE DIAGNOSIS:    Severe multivessel disease with 100% CTO of the Circumflex, 90% proximal Ramus, 95% proximal RCA with  100% proximal RPL2 along with moderate to severe mid LAD disease.  Physiologically not-significant ~70% lesion in LAD - FFR 0.92  Mild-moderately increased LVEDP  Hypoglycemic on arrival to cath lab - CBG 54 (Glucose given) - post-cath CBG 63 (1/2 Amp Glucose given)  PLAN OF CARE:  IVF hydration for elevated baseline Creatinine  Recheck labs in AM & anticipate d/c if stable.  Will need aggressive medical Rx of existing CAD & close monitoring of the LAD. Consider non-invasive stress testing to assess for Circumflex / LAD ischemia.  DAPT x minimum of 3 months (is on Aggrenox) - Plavix added. May be able to convert to Plavix alone to complete 1 yr given ACS.   Would not transition  the patient from [place] team to Invasive Service due to length of stay & complexity of co-morbidities.  Leonie Man, M.D., M.S. Interventional Cardiologist   Pager # [phone]

## 2014-11-16 NOTE — Progress Notes (Signed)
Inpatient Diabetes Program Recommendations  AACE/ADA: New Consensus Statement on Inpatient Glycemic Control (2013)  Target Ranges:  Prepandial:   less than 140 mg/dL      Peak postprandial:   less than 180 mg/dL (1-2 hours)      Critically ill patients:  140 - 180 mg/dL   Results for Brendan Torres, Brendan Torres (MRN [id number]) as of 11/16/2014 13:34  Ref. Range 11/15/2014 07:51 11/15/2014 08:47 11/15/2014 11:54 11/15/2014 16:14 11/15/2014 20:15 11/16/2014 07:37 11/16/2014 08:12 11/16/2014 11:53 11/16/2014 11:55 11/16/2014 12:31  Glucose-Capillary Latest Ref Range: 70-99 mg/dL 66 (L) 68 (L) 132119 (H) 440120 (H) 201 (H) 63 (L) 81 47 (L) 38 (LL) 71    Diabetes history: DM 2 Outpatient Diabetes medications: Lantus 74 units QHS Current orders for Inpatient glycemic control: Lantus 35 units QHS, Novolog 0-9 units TID, Novolog 3 units meal coverage.  Inpatient Diabetes Program Recommendations  Insulin - Basal: Due to hypoglycemia, let's try decreasing basal insulin to Lantus 10 units QHS to prevent his hypoglycemia episodes. Patient is on much more at home. Renal function would not be a contributing factor to the hypoglycemia. Not sure why he is not able to tolerate the basal dosing.   Thanks,  Christena DeemShannon Ysmael Hires RN, MSN, [facility] Inpatient Diabetes Coordinator Team Pager [phone]

## 2014-11-16 NOTE — Progress Notes (Signed)
1800 on 11/16/2014 have attempted to see patient on 3 occasions and patient remained in the cardiac catheterization lab. Patient will be seen in the a.m.

## 2014-11-16 NOTE — Progress Notes (Signed)
Subjective:  No chest pain or shortness of breath  Objective:   Vital Signs : Filed Vitals:   11/16/14 0200 11/16/14 0400 11/16/14 0600 11/16/14 0737  BP: 131/68 135/66 127/69 115/62  Pulse: 63 68 56 60  Temp:  98.4 F (36.9 C)  97.8 F (36.6 C)  TempSrc:  Oral  Oral  Resp:  0 10 17  Height:      Weight:      SpO2: 96% 96% 99% 99%    Intake/Output from previous day:  Intake/Output Summary (Last 24 hours) at 11/16/14 0758 Last data filed at 11/16/14 0655  Gross per 24 hour  Intake   2912 ml  Output   1470 ml  Net   1442 ml    I/O since admission: -740.8  Wt Readings from Last 3 Encounters:  11/15/14 236 lb 8.9 oz (107.3 kg)    Medications: . amLODipine  10 mg Oral Daily  . antiseptic oral rinse  7 mL Mouth Rinse BID  . dipyridamole-aspirin  1 capsule Oral BID  . hydrALAZINE  50 mg Oral TID  . insulin aspart  0-9 Units Subcutaneous TID WC  . insulin aspart  3 Units Subcutaneous TID WC  . insulin glargine  36 Units Subcutaneous QHS  . isosorbide mononitrate  30 mg Oral Daily  . potassium chloride  20 mEq Oral BID  . simvastatin  20 mg Oral q1800  . sodium chloride  3 mL Intravenous Q12H    . sodium chloride 75 mL/hr (11/15/14 1727)  . sodium chloride 75 mL/hr at 11/16/14 0615  . heparin 1,600 Units/hr (11/15/14 1726)    Physical Exam:   General appearance: alert, cooperative and no distress Neck: no adenopathy, no JVD, supple, symmetrical, trachea midline and thyroid not enlarged, symmetric, no tenderness/mass/nodules Lungs: decreased BS; no wheezing Heart: regular rate and rhythm and 1/6 sem; no s3, rub Abdomen: soft, non-tender; bowel sounds normal; no masses,  no organomegaly Extremities: no edema, redness or tenderness in the calves or thighs Skin: Skin color, texture, turgor normal. No rashes or lesions  Neuro: grossly nonfocal   Rate: 68  Rhythm: normal sinus rhythm  ECG (independently read by me): NSR at 75; LVH;  ST changes V3-6  Lab  Results:  BMP Latest Ref Rng 11/16/2014 11/15/2014 11/14/2014  Glucose 70 - 99 mg/dL 78 161(W149(H) 92  BUN 6 - 23 mg/dL 21 22 19   Creatinine 0.50 - 1.35 mg/dL 9.60(A1.69(H) 5.40(J1.92(H) 8.11(B1.79(H)  Sodium 135 - 145 mmol/L 143 143 142  Potassium 3.5 - 5.1 mmol/L 4.3 4.1 3.7  Chloride 96 - 112 mmol/L 105 101 100  CO2 19 - 32 mmol/L 28 34(H) 33(H)  Calcium 8.4 - 10.5 mg/dL 8.9 9.0 8.7     CBC Latest Ref Rng 11/16/2014 11/15/2014 11/14/2014  WBC 4.0 - 10.5 K/uL 7.0 6.6 9.9  Hemoglobin 13.0 - 17.0 g/dL 12.3(L) 13.1 13.7  Hematocrit 39.0 - 52.0 % 39.6 41.9 43.4  Platelets 150 - 400 K/uL 323 345 356     No results for input(s): TROPONINI in the last 72 hours.  Invalid input(s): CK, MB  Hepatic Function Panel No results for input(s): PROT, ALBUMIN, AST, ALT, ALKPHOS, BILITOT, BILIDIR, IBILI in the last 72 hours.  Recent Labs  11/15/14 0240  INR 1.20   BNP (last 3 results)  Recent Labs  11/12/14 1014 11/12/14 1340  BNP 494.4* 469.1*    ProBNP (last 3 results) No results for input(s): PROBNP in the last 8760 hours.  Lipid Panel  No results found for: CHOL, TRIG, HDL, CHOLHDL, VLDL, LDLCALC, LDLDIRECT    Imaging:  Echocardiogram 11/13/2014: Study Conclusions  - Left ventricle: The cavity size was normal. Wall thickness was increased in a pattern of mild LVH. Systolic function was normal. The estimated ejection fraction was in the range of 55% to 60%. There is akinesis of the inferolateral myocardium. Doppler parameters are consistent with abnormal left ventricular relaxation (grade 1 diastolic dysfunction). - Aortic valve: There was trivial regurgitation.  Impressions:  - Akinesis of the basal inferior lateral wall with overall preserved LV function; grade 1 diastolic dysfunction; calcified aortic valve with no AS by doppler; trace AI.   Assessment/Plan:   Principal Problem:   Acute heart failure, systolic Active Problems:   Elevated troponin   CVA, old,  hemiparesis   Diabetes mellitus ty 2   HTN (hypertension)   Obesity (BMI 30.0-34.9)   Former smoker   Tremor   Dyspnea   Hematuria, gross   Acute coronary syndrome   Acute renal injury  1. NSTEMI; no recurrent chest pain 2. CAD: Known disease with occluded OM with collateral at 2010 cath; akinesis of inferolateral wall on echo. 3. CKD: Cr improved to 1.69 today with hydration yesterday 4. DM2 5. Obesity 6. Old CVA  No chest pain. Feels better. Cr improved with hydration. Will increase NS rate now to 125 cc/hr. Plan cath later this morning with limited dye load if possible. The risks and benefits of a cardiac catheterization including, but not limited to, death, stroke, MI, kidney damage and bleeding were discussed with the patient who indicates understanding and agrees to proceed.    Lennette Bihari, MD, [facility] 11/16/2014, 7:58 AM

## 2014-11-16 NOTE — H&P (View-Only) (Signed)
 Subjective:  No chest pain or shortness of breath  Objective:   Vital Signs : Filed Vitals:   11/16/14 0200 11/16/14 0400 11/16/14 0600 11/16/14 0737  BP: 131/68 135/66 127/69 115/62  Pulse: 63 68 56 60  Temp:  98.4 F (36.9 C)  97.8 F (36.6 C)  TempSrc:  Oral  Oral  Resp:  0 10 17  Height:      Weight:      SpO2: 96% 96% 99% 99%    Intake/Output from previous day:  Intake/Output Summary (Last 24 hours) at 11/16/14 0758 Last data filed at 11/16/14 0655  Gross per 24 hour  Intake   2912 ml  Output   1470 ml  Net   1442 ml    I/O since admission: -740.8  Wt Readings from Last 3 Encounters:  11/15/14 236 lb 8.9 oz (107.3 kg)    Medications: . amLODipine  10 mg Oral Daily  . antiseptic oral rinse  7 mL Mouth Rinse BID  . dipyridamole-aspirin  1 capsule Oral BID  . hydrALAZINE  50 mg Oral TID  . insulin aspart  0-9 Units Subcutaneous TID WC  . insulin aspart  3 Units Subcutaneous TID WC  . insulin glargine  36 Units Subcutaneous QHS  . isosorbide mononitrate  30 mg Oral Daily  . potassium chloride  20 mEq Oral BID  . simvastatin  20 mg Oral q1800  . sodium chloride  3 mL Intravenous Q12H    . sodium chloride 75 mL/hr (11/15/14 1727)  . sodium chloride 75 mL/hr at 11/16/14 0615  . heparin 1,600 Units/hr (11/15/14 1726)    Physical Exam:   General appearance: alert, cooperative and no distress Neck: no adenopathy, no JVD, supple, symmetrical, trachea midline and thyroid not enlarged, symmetric, no tenderness/mass/nodules Lungs: decreased BS; no wheezing Heart: regular rate and rhythm and 1/6 sem; no s3, rub Abdomen: soft, non-tender; bowel sounds normal; no masses,  no organomegaly Extremities: no edema, redness or tenderness in the calves or thighs Skin: Skin color, texture, turgor normal. No rashes or lesions  Neuro: grossly nonfocal   Rate: 68  Rhythm: normal sinus rhythm  ECG (independently read by me): NSR at 75; LVH;  ST changes V3-6  Lab  Results:  BMP Latest Ref Rng 11/16/2014 11/15/2014 11/14/2014  Glucose 70 - 99 mg/dL 78 149(H) 92  BUN 6 - 23 mg/dL 21 22 19  Creatinine 0.50 - 1.35 mg/dL 1.69(H) 1.92(H) 1.79(H)  Sodium 135 - 145 mmol/L 143 143 142  Potassium 3.5 - 5.1 mmol/L 4.3 4.1 3.7  Chloride 96 - 112 mmol/L 105 101 100  CO2 19 - 32 mmol/L 28 34(H) 33(H)  Calcium 8.4 - 10.5 mg/dL 8.9 9.0 8.7     CBC Latest Ref Rng 11/16/2014 11/15/2014 11/14/2014  WBC 4.0 - 10.5 K/uL 7.0 6.6 9.9  Hemoglobin 13.0 - 17.0 g/dL 12.3(L) 13.1 13.7  Hematocrit 39.0 - 52.0 % 39.6 41.9 43.4  Platelets 150 - 400 K/uL 323 345 356     No results for input(s): TROPONINI in the last 72 hours.  Invalid input(s): CK, MB  Hepatic Function Panel No results for input(s): PROT, ALBUMIN, AST, ALT, ALKPHOS, BILITOT, BILIDIR, IBILI in the last 72 hours.  Recent Labs  11/15/14 0240  INR 1.20   BNP (last 3 results)  Recent Labs  11/12/14 1014 11/12/14 1340  BNP 494.4* 469.1*    ProBNP (last 3 results) No results for input(s): PROBNP in the last 8760 hours.     Lipid Panel  No results found for: CHOL, TRIG, HDL, CHOLHDL, VLDL, LDLCALC, LDLDIRECT    Imaging:  Echocardiogram 11/13/2014: Study Conclusions  - Left ventricle: The cavity size was normal. Wall thickness was increased in a pattern of mild LVH. Systolic function was normal. The estimated ejection fraction was in the range of 55% to 60%. There is akinesis of the inferolateral myocardium. Doppler parameters are consistent with abnormal left ventricular relaxation (grade 1 diastolic dysfunction). - Aortic valve: There was trivial regurgitation.  Impressions:  - Akinesis of the basal inferior lateral wall with overall preserved LV function; grade 1 diastolic dysfunction; calcified aortic valve with no AS by doppler; trace AI.   Assessment/Plan:   Principal Problem:   Acute heart failure, systolic Active Problems:   Elevated troponin   CVA, old,  hemiparesis   Diabetes mellitus ty 2   HTN (hypertension)   Obesity (BMI 30.0-34.9)   Former smoker   Tremor   Dyspnea   Hematuria, gross   Acute coronary syndrome   Acute renal injury  1. NSTEMI; no recurrent chest pain 2. CAD: Known disease with occluded OM with collateral at 2010 cath; akinesis of inferolateral wall on echo. 3. CKD: Cr improved to 1.69 today with hydration yesterday 4. DM2 5. Obesity 6. Old CVA  No chest pain. Feels better. Cr improved with hydration. Will increase NS rate now to 125 cc/hr. Plan cath later this morning with limited dye load if possible. The risks and benefits of a cardiac catheterization including, but not limited to, death, stroke, MI, kidney damage and bleeding were discussed with the patient who indicates understanding and agrees to proceed.    Lear Carstens A. Evolett Somarriba, MD, FACC 11/16/2014, 7:58 AM 

## 2014-11-16 NOTE — Interval H&P Note (Signed)
History and Physical Interval Note:  11/16/2014 3:42 PM  Brendan Torres  has presented today for surgery, with the diagnosis of NSTEMI & known CAD.   The various methods of treatment have been discussed with the patient and family. After consideration of risks, benefits and other options for treatment, the patient has consented to  Procedure(s): LEFT HEART CATHETERIZATION WITH CORONARY ANGIOGRAM (N/A) +/- PCI as a surgical intervention .  The patient's history has been reviewed, patient examined, no change in status, stable for surgery.  I have reviewed the patient's chart and labs.  Questions were answered to the patient's satisfaction.     Cath Lab Visit (complete for each Cath Lab visit)  Clinical Evaluation Leading to the Procedure:   ACS: Yes.    Non-ACS:    Anginal Classification: CCS IV  Anti-ischemic medical therapy: Maximal Therapy (2 or more classes of medications)  Non-Invasive Test Results: No non-invasive testing performed  Prior CABG: No previous CABG   HARDING, DAVID W

## 2014-11-16 NOTE — Progress Notes (Signed)
bs 95 via finger stick, R hand neurovascular status intact; report called to Jim,RN ; monitoring.

## 2014-11-16 NOTE — Progress Notes (Signed)
ANTICOAGULATION CONSULT NOTE - Follow Up Consult  Pharmacy Consult for Heparin  Indication: chest pain/ACS  No Known Allergies  Patient Measurements: Height: 5\' 10"  (177.8 cm) Weight: 248 lb 8 oz (112.719 kg) IBW/kg (Calculated) : 73  Vital Signs: Temp: 97.8 F (36.6 C) (04/19 1154) Temp Source: Oral (04/19 1154) BP: 125/63 mmHg (04/19 1200) Pulse Rate: 57 (04/19 1215)  Labs:  Recent Labs  11/14/14 0325 11/14/14 1315 11/15/14 0240 11/16/14 0324  HGB 13.7  --  13.1 12.3*  HCT 43.4  --  41.9 39.6  PLT 356  --  345 323  LABPROT  --   --  15.4*  --   INR  --   --  1.20  --   HEPARINUNFRC 0.35 0.51 0.35 0.37  CREATININE 1.79*  --  1.92* 1.69*    Estimated Creatinine Clearance: 43.8 mL/min (by C-G formula based on Cr of 1.69).   . sodium chloride 125 mL/hr at 11/16/14 1233  . heparin 1,600 Units/hr (11/16/14 1037)    Assessment: 81 yo male on IV heparin for r/o ACS.  Heparin level remains therapeutic on heparin at 1600 units/hr.  No bleeding or complications noted.  Plan for cath today.  Goal of Therapy:  Heparin level 0.3-0.7 units/ml Monitor platelets by anticoagulation protocol: Yes   Plan:  -Continue heparin 1600 units/hr. -Daily CBC/HL -F/u post cath  Christoper Fabianaron Winston Sobczyk, PharmD, BCPS Clinical pharmacist, pager [phone]  11/16/2014 1:19 PM

## 2014-11-16 NOTE — Progress Notes (Signed)
CRITICAL VALUE ALERT  Critical value received:  Low blood sugar 4/19 - 7:47 CBG 63 12:00 CBG 38  Date of notification:  11/16/2014  Time of notification:  12:05  Nurse who received alert:  Dayton BailiffMaggie Collie Kittel, RN  MD notified (1st page):  [place]  Time of first page: 12:07  Responding MD:  Joseph ArtWoods  Time MD responded:  12:08

## 2014-11-17 DIAGNOSIS — R410 Disorientation, unspecified: Secondary | ICD-10-CM | POA: Diagnosis not present

## 2014-11-17 DIAGNOSIS — Z9114 Patient's other noncompliance with medication regimen: Secondary | ICD-10-CM

## 2014-11-17 DIAGNOSIS — E669 Obesity, unspecified: Secondary | ICD-10-CM

## 2014-11-17 LAB — BASIC METABOLIC PANEL
ANION GAP: 6 (ref 5–15)
BUN: 14 mg/dL (ref 6–23)
CALCIUM: 8.9 mg/dL (ref 8.4–10.5)
CO2: 29 mmol/L (ref 19–32)
Chloride: 108 mmol/L (ref 96–112)
Creatinine, Ser: 1.54 mg/dL — ABNORMAL HIGH (ref 0.50–1.35)
GFR calc non Af Amer: 41 mL/min — ABNORMAL LOW (ref >90)
GFR, EST AFRICAN AMERICAN: 47 mL/min — AB (ref >90)
Glucose, Bld: 89 mg/dL (ref 70–99)
Potassium: 4.4 mmol/L (ref 3.5–5.1)
Sodium: 143 mmol/L (ref 135–145)

## 2014-11-17 LAB — CBC
HCT: 38.4 % — ABNORMAL LOW (ref 39.0–52.0)
Hemoglobin: 12 g/dL — ABNORMAL LOW (ref 13.0–17.0)
MCH: 28.6 pg (ref 26.0–34.0)
MCHC: 31.3 g/dL (ref 30.0–36.0)
MCV: 91.4 fL (ref 78.0–100.0)
PLATELETS: 320 10*3/uL (ref 150–400)
RBC: 4.2 MIL/uL — ABNORMAL LOW (ref 4.22–5.81)
RDW: 13.6 % (ref 11.5–15.5)
WBC: 6.1 10*3/uL (ref 4.0–10.5)

## 2014-11-17 LAB — GLUCOSE, CAPILLARY
Glucose-Capillary: 92 mg/dL (ref 70–99)
Glucose-Capillary: 117 mg/dL — ABNORMAL HIGH (ref 70–99)

## 2014-11-17 LAB — HEMOGLOBIN A1C
Hgb A1c MFr Bld: 7.7 % — ABNORMAL HIGH (ref 4.8–5.6)
Mean Plasma Glucose: 174 mg/dL

## 2014-11-17 LAB — AMMONIA: Ammonia: 40 umol/L — ABNORMAL HIGH (ref 11–32)

## 2014-11-17 MED ORDER — METOPROLOL TARTRATE 25 MG PO TABS
25.0000 mg | ORAL_TABLET | Freq: Two times a day (BID) | ORAL | Status: DC
  Administered 2014-11-17 – 2014-11-18 (×3): 25 mg via ORAL
  Filled 2014-11-17 (×3): qty 1

## 2014-11-17 MED ORDER — INSULIN GLARGINE 100 UNIT/ML ~~LOC~~ SOLN
20.0000 [IU] | Freq: Every day | SUBCUTANEOUS | Status: DC
  Filled 2014-11-17 (×2): qty 0.2

## 2014-11-17 MED ORDER — HEPARIN SODIUM (PORCINE) 5000 UNIT/ML IJ SOLN
5000.0000 [IU] | Freq: Three times a day (TID) | INTRAMUSCULAR | Status: DC
  Administered 2014-11-17: 5000 [IU] via SUBCUTANEOUS
  Filled 2014-11-17: qty 1

## 2014-11-17 MED ORDER — ISOSORBIDE MONONITRATE ER 60 MG PO TB24
60.0000 mg | ORAL_TABLET | Freq: Every day | ORAL | Status: DC
  Administered 2014-11-18: 60 mg via ORAL
  Filled 2014-11-17: qty 1

## 2014-11-17 MED ORDER — HALOPERIDOL LACTATE 5 MG/ML IJ SOLN
5.0000 mg | Freq: Once | INTRAMUSCULAR | Status: AC
  Administered 2014-11-17: 5 mg via INTRAMUSCULAR
  Filled 2014-11-17: qty 1

## 2014-11-17 MED ORDER — HALOPERIDOL LACTATE 5 MG/ML IJ SOLN
2.0000 mg | Freq: Four times a day (QID) | INTRAMUSCULAR | Status: DC | PRN
  Administered 2014-11-18: 2 mg via INTRAVENOUS
  Filled 2014-11-17: qty 0.4
  Filled 2014-11-17: qty 1

## 2014-11-17 NOTE — Progress Notes (Signed)
TR BAND REMOVAL  LOCATION:  right radial  DEFLATED PER PROTOCOL:  Yes.    TIME BAND OFF / DRESSING APPLIED:  2300    SITE UPON ARRIVAL:   Level 0  SITE AFTER BAND REMOVAL:  Level 0  REVERSE ALLEN'S TEST:    positive  CIRCULATION SENSATION AND MOVEMENT:  Within Normal Limits  Yes.    COMMENTS: delay is deflation due to patient being confused and wont let me touch him he is also non compliant with arm restrictions to the hand

## 2014-11-17 NOTE — Progress Notes (Signed)
Subjective:  Up in chair, calm but tense. No chest pain or SOB.  Objective:  Vital Signs in the last 24 hours: Temp:  [97.5 F (36.4 C)-98.3 F (36.8 C)] 98.3 F (36.8 C) (04/20 0340) Pulse Rate:  [57-70] 70 (04/20 0340) Resp:  [11-19] 14 (04/20 0340) BP: (121-154)/(54-83) 126/54 mmHg (04/20 0340) SpO2:  [86 %-98 %] 96 % (04/20 0340) Weight:  [244 lb 11.4 oz (111 kg)] 244 lb 11.4 oz (111 kg) (04/20 0019)  Intake/Output from previous day:  Intake/Output Summary (Last 24 hours) at 11/17/14 1121 Last data filed at 11/16/14 2300  Gross per 24 hour  Intake 819.42 ml  Output   1675 ml  Net -855.58 ml    Physical Exam: General appearance: alert, cooperative, no distress and moderately obese Neck: no JVD Lungs: clear to auscultation bilaterally Heart: regular rate and rhythm Extremities: no edema, Rt radial site without hematoma Neurologic: Grossly normal   Rate: 72  Rhythm: normal sinus rhythm  Lab Results:  Recent Labs  11/16/14 0324 11/17/14 0345  WBC 7.0 6.1  HGB 12.3* 12.0*  PLT 323 320    Recent Labs  11/16/14 0324 11/17/14 0345  NA 143 143  K 4.3 4.4  CL 105 108  CO2 28 29  GLUCOSE 78 89  BUN 21 14  CREATININE 1.69* 1.54*   No results for input(s): TROPONINI in the last 72 hours.  Invalid input(s): CK, MB  Recent Labs  11/15/14 0240  INR 1.20   Meds . amLODipine  10 mg Oral Daily  . clopidogrel  75 mg Oral Q breakfast  . dipyridamole-aspirin  1 capsule Oral BID  . haloperidol lactate  5 mg Intramuscular Once  . heparin subcutaneous  5,000 Units Subcutaneous 3 times per day  . hydrALAZINE  50 mg Oral TID  . insulin aspart  0-9 Units Subcutaneous TID WC  . insulin glargine  20 Units Subcutaneous QHS  . isosorbide mononitrate  30 mg Oral Daily  . simvastatin  20 mg Oral q1800    Imaging: Imaging results have been reviewed  Cardiac Studies: Echo 11/13/14 Study Conclusions  - Left ventricle: The cavity size was normal. Wall  thickness was increased in a pattern of mild LVH. Systolic function was normal. The estimated ejection fraction was in the range of 55% to 60%. There is akinesis of the inferolateral myocardium. Doppler parameters are consistent with abnormal left ventricular relaxation (grade 1 diastolic dysfunction). - Aortic valve: There was trivial regurgitation.  Impressions:  - Akinesis of the basal inferior lateral wall with overall preserved LV function; grade 1 diastolic dysfunction; calcified aortic valve with no AS by doppler; trace AI.   Assessment/Plan:  81 y.o. year old male with a reported history of CAD, MI x 2 (latest was 3 years ago, required cath and stenting x 2), CVA on Aggrenox, HTN, CHF, DM, HLD admitted 04/15 with shortness of breath x 3 days. SCr elevated. Pt was hydrated gently. Troponin elevated c/w ACS. Cath done 4/19 revealed 3V CAD with CTO of the CFX, 95% RCA, 70-80 LAD. The LAD lesion was not felt to be significant by FFR and the pt underwent PCI with BMS to the RCA 11/16/14.   Principal Problem:   Dyspnea Active Problems:   Elevated troponin   Acute coronary syndrome   CAD S/P PCI 2011, RCA BMS 11/16/14   Acute delirium   Diabetes mellitus ty 2   HTN (hypertension)   Acute renal injury   CVA, old,  hemiparesis   Obesity (BMI 30.0-34.9)   Tremor   Hematuria, gross   PLAN: Pt became aggressive towards family last night. Possibly sedation in cath lab exacerbated underlying early paranoid type dementia. Psych consult requested.   Pt on Plavix and Aggrenox (BMS). Residual LAD lesion not felt to be significant by FFR. CTO CFX.  Corine ShelterLuke Kilroy PA-C Beeper [phone] 11/17/2014, 11:21 AM   Patient seen and examined. Agree with assessment and plan. Cr 1.54 improved with hydration post cath. S/P PCI to RCA; medical therapy for LCX occlusion and LAD disease. Will add BB and titrate Imdur to 60 mg for concomitant CAD. ? DC aggrenox if on ASA/Plavix.   Lennette Biharihomas A.  Ayvion Kavanagh, MD, [facility] 11/17/2014 2:10 PM

## 2014-11-17 NOTE — Progress Notes (Addendum)
Inpatient Diabetes Program Recommendations  AACE/ADA: New Consensus Statement on Inpatient Glycemic Control (2013)  Target Ranges:  Prepandial:   less than 140 mg/dL      Peak postprandial:   less than 180 mg/dL (1-2 hours)      Critically ill patients:  140 - 180 mg/dL    Results for Brendan Torres, Brendan Torres (MRN [id number]) as of 11/17/2014 09:08  Ref. Range 11/16/2014 07:37 11/16/2014 08:12 11/16/2014 11:53 11/16/2014 11:55 11/16/2014 12:31 11/16/2014 15:22 11/16/2014 15:46 11/16/2014 18:04 11/16/2014 18:08 11/16/2014 18:32 11/16/2014 21:40  Glucose-Capillary Latest Ref Range: 70-99 mg/dL 63 (L) 81 47 (L) 38 (LL) 71 52 (L) 88 43 (LL) 63 (L) 95 130 (H)     Home DM medications: Lantus 74 units QHS  Current Insulin Orders: Lantus 36 units QHS, Novolog 0-9 units TID, Novolog 3 units meal coverage.   **Mulitple episodes of Hypoglycemia yesterday (04/19) after receiving 36 units Lantus the night prior (04/18).  **Note Lantus insulin held last PM per patient and family refusal.  **AM CBG today actually OK despite no Lantus given- CBG 92 mg/dl.  **Patient takes large dose of Lantus at home- Not sure why he is not tolerating Lantus here in hospital.  Perhaps his home dose of Lantus is dosed inappropriately and is covering food intake at home as well?    MD- Please consider further reduction of Lantus to 18 units QHS (25% of his home dose of Lantus)     Will follow Ambrose FinlandJeannine Johnston Challis Crill RN, MSN, CDE Diabetes Coordinator Inpatient Diabetes Program Team Pager: [phone] (8a-5p)

## 2014-11-17 NOTE — Progress Notes (Signed)
Pt resting now. Will hold until family available.  Ethelda ChickKristan Averyanna Sax CES, ACSM 9:51 AM 11/17/2014

## 2014-11-17 NOTE — Progress Notes (Signed)
[place] TEAM 1 - Stepdown/ICU TEAM Progress Note  Brendan Torres ZOX:[id number] DOB: [birth year] DOA: 11/12/2014 PCP: Melida QuitterSIDHU,AMANJOT, MD  Admit HPI / Brief Narrative: 81 y/o M resident of [place], [place] here on a visit to [place] w/ a knwon history of CAD with stent X2 2011, CVA 2012?, HTN, DM, HLD, and morbid obesity who [facility] c/o shortness of breath.  He admitted he had been noncompliant with usual heart healthy diet for 3 days and had forgotten his medications at home in [place].  In the ED his point of care troponin was 0.7.    HPI/Subjective: The patient has become severely agitated and delirious over the last 24-36 hours.  He is currently urinating on the floor (during my visit), and has pulled out numerous IVs as well as his foley cath x2.  He is not oriented to place or situation, and appears to believe the staff are trying to mislead him.  His family has noted that this behavior is not entirely new, and state that he has a hx of "some kind of psychiatric disease" though futher details are not clear.  The family has attempted to pursue outpt w/u recently, but was unable to do so due to noncompliance on behalf of the pt.    Assessment/Plan:  Severe acute agitation - Psychosis v/s acute delirium obtain metabolic eval - haldol prn for now as pt at very high risk for harming himself accidentally - Psych consulted - unable to image brain at this time as pt not cooperative   Severe multivessel CAD - Elevated troponin w/o chest pain  Trop peaked at 0.78 - cardiac cath 4/19 revealed 100% CTO of the Circumflex, 90% proximal Ramus, 95% proximal RCA with 100% proximal RPL2 along with moderate to severe mid LAD disease - pt underwent stent placement in RCA lesion - DAPT x minimum of 3 months per Cards, w/ ?Plavix alone to complete full year of tx   Acute on CKD (stage 3, GFR 30-59 ml/min at baseline) Continue to hold diuretic and ACE  inhibitor - w/ hydration his crt has returned to his baseline   Acute diastolic CHF exacerbation causing acute hypoxic respiratory failure Negative approximately 1.5 L since admission - holding further diuresis due to worsened renal function - follow I's and O's and weights - wean oxygen as able - clinically appears euvolemic - stopped ACE inhibitor until renal function stabilizes - baseline weight unclear as patient does not live in [place]  Mildly elevated d-dimer venous duplex bilateral lower extremities without evidence of DVT - further eval not presently indicated   CVA, old, hemiparesis continue Aggrenox - no new deficits appreciated  Diabetes mellitus 2 some episodes of hypoglycemia persist - adjust treatment and follow  HTN Blood pressure currently well controlled  Obesity - Body mass index is 35.11 kg/(m^2).   Hx of OSA noncompliant w/ CPAP   Hematuria Due to traumatic removal of Foley catheter x2 per patient - follow clinically and monitor hemoglobin - do not replace foley cath but will have to watch for evidence of retention - pt is currently urinating freely onto the floor w/o cath in place   Code Status: FULL Family Communication: no family present at time of exam  Disposition Plan: transfer to tele bed - Psych consult pending - metabolic eval pending - sitter for safety   Cons[facility]ants[facility]  Procedures: TTE - 4/17 - mild LVH, ejection fraction 55-60%, akinesis of the inferolateral  myocardium, grade 1 diastolic dysfunction Cardiac cath w/ stent placement - 4/19  Antibiotics: None  DVT prophylaxis: SQ heparin  Objective: Blood pressure 126/54, pulse 70, temperature 98.3 F (36.8 C), temperature source Oral, resp. rate 14, height  (1.778 m), weight 111 kg (244 lb 11.4 oz), SpO2 96 %.  Intake/Output Summary (Last 24 hours) at 11/17/14 1001 Last data filed at 11/16/14 2300  Gross per 24 hour  Intake 819.42 ml  Output   1675 ml    Net -855.58 ml   Exam: General: No acute respiratory distress - severely agitated Lungs: Clear to auscultation bilaterally without wheezes or crackles Cardiovascular: Regular rate and rhythm - no appreciable murmur Abdomen: Nontender, nondistended, soft, bowel sounds positive Extremities: No significant cyanosis, clubbing;  trace edema bilateral lower extremities  Data Reviewed: Basic Metabolic Panel:  Recent Labs Lab 11/13/14 0312 11/14/14 0325 11/15/14 0240 11/16/14 0324 11/17/14 0345  NA 146* 142 143 143 143  K 3.4* 3.7 4.1 4.3 4.4  CL 103 100 101 105 108  CO2 33* 33* 34* 28 29  GLUCOSE 45* 92 149* 78 89  BUN CREATININE 1.55* 1.79* 1.92* 1.69* 1.54*  CALCIUM 9.0 8.7 9.0 8.9 8.9    Liver Function Tests:  Recent Labs Lab 11/12/14 0341  AST 26  ALT 26  ALKPHOS 77  BILITOT 0.5  PROT 8.1  ALBUMIN 3.0*    CBC:  Recent Labs Lab 11/13/14 0312 11/14/14 0325 11/15/14 0240 11/16/14 0324 11/17/14 0345  WBC 8.6 9.9 6.6 7.0 6.1  HGB 14.5 13.7 13.1 12.3* 12.0*  HCT 45.7 43.4 41.9 39.6 38.4*  MCV 90.7 91.0 91.9 91.9 91.4  PLT 358 356 345 323 320    Cardiac Enzymes:  Recent Labs Lab 11/12/14 0403 11/12/14 1014 11/12/14 1343 11/12/14 2054  TROPONINI 0.72* 0.78* 0.63* 0.70*    CBG:  Recent Labs Lab 11/16/14 1804 11/16/14 1808 11/16/14 1832 11/16/14 2140 11/17/14 0341  GLUCAP 43* 63* 95 130* 92    Recent Results (from the past 240 hour(s))  MRSA PCR Screening     Status: None   Collection Time: 11/12/14  8:26 AM  Result Value Ref Range Status   MRSA by PCR NEGATIVE NEGATIVE Final    Comment:        The GeneXpert MRSA Assay (FDA approved for NASAL specimens only), is one component of a comprehensive MRSA colonization surveillance program. It is not intended to diagnose MRSA infection nor to guide or monitor treatment for MRSA infections.      Studies:   Recent x-ray studies have been reviewed in detail by the  Attending Physician  Scheduled Meds:  Scheduled Meds: . adenosine  16 mL Intravenous Once  . amLODipine  10 mg Oral Daily  . clopidogrel  75 mg Oral Q breakfast  . dipyridamole-aspirin  1 capsule Oral BID  . haloperidol lactate  5 mg Intramuscular Once  . hydrALAZINE  50 mg Oral TID  . insulin aspart  0-9 Units Subcutaneous TID WC  . insulin aspart  3 Units Subcutaneous TID WC  . insulin glargine  36 Units Subcutaneous QHS  . isosorbide mononitrate  30 mg Oral Daily  . potassium chloride  20 mEq Oral BID  . simvastatin  20 mg Oral q1800    Time spent on care of this patient: 35 mins   Rolly Magri T , MD   [facility] Office  [phone] Pager - Text Page per Loretha Stapler as per below:  On-Call/Text  Page:      [email or web address]      password [email or web address]  If 7PM-7AM, please contact night-coverage [email or web address] Password [email or web address] 11/17/2014, 10:01 AM   LOS: 5 days

## 2014-11-17 NOTE — Progress Notes (Signed)
Pt sitting up in chair w/ wife and sitter in room.  Swallowed pills without difficulty.  Quiet and agreeable, denies complaints, allowed RN to give [place] Heparin.  Report called to [facility], transferring to [id number] per W/C with NT and sitter.

## 2014-11-17 NOTE — Progress Notes (Addendum)
Two daughter came to help with patient but they did not stay long because he started yelling at them he is mad they sent him here. I spoke with Nicole CellaDorothy the daughter that takes care of him she states the father has been changing being more aggressive forgetting and refusing medical care, hygiene, and medications. He  is worse at night then daytime. She did request for a psych consult to help find medications that would help and to see if he has any dementia. She said she tried to talk with his primary MD but he has not done anything. She said even though they live in [place] she would drive to [place] and he also has another daughter here. She wants to get help why he is here in the hospital because patient refuses to go doctors. Her concern is about him getting more aggressive at home with wife and herself. I will pass to day RN to  ask MD if possible for psych consult while in the hospital or if they can help with this issue.  MD may call daughter Delight Hoh/caretaker Dorothy at [phone]

## 2014-11-17 NOTE — Consult Note (Signed)
[facility] Face-to-Face Psychiatry Consult   Reason for Consult:  Capacity evaluation and acute delirium and dementia Referring Physician:  Dr. Thereasa Solo Patient Identification: Brendan Torres MRN:  [id number] Principal Diagnosis: Acute delirium Diagnosis:   Patient Active Problem List   Diagnosis Date Noted  . Acute delirium [R41.0] 11/17/2014  . Coronary artery disease involving native coronary artery of native heart with unstable angina pectoris [I25.110]   . Acute coronary syndrome [I24.9]   . Acute renal injury [N17.9]   . Hematuria, gross [R31.0]   . Dyspnea [R06.00]   . Elevated troponin [R79.89] 11/12/2014  . Acute heart failure, systolic [E72.0] 94/70/9628  . CVA, old, hemiparesis [I69.359] 11/12/2014  . Diabetes mellitus ty 2 [E11.9] 11/12/2014  . HTN (hypertension) [I10] 11/12/2014  . Obesity (BMI 30.0-34.9) [E66.9] 11/12/2014  . Former smoker [Z66.294] 11/12/2014  . Tremor [R25.1] 11/12/2014    Total Time spent with patient: 1 hour  Subjective:   Brendan Torres is a 81 y.o. male patient admitted with agitated delirium.  HPI: Brendan Torres is a 81 years old male admitted to [facility] for shortness of breath and history of noncompliant with his medication management secondary to easily forgetful. Case discussed with staff RN, social worker and Dr. Thereasa Solo. Patient was seen sitting in a chair next to his bed with the safety sitter in his room. Reportedly patient has been visiting family here in [place] from [place]. Patient has a daughter in [place] and other daughter from [place] moved in to help him and his wife. Reportedly patient wife has been suffering with dementia. Patient remember having difficulty with his memory and not able to recall names. Patient reportedly taken to the doctors appointment by his daughter because he cannot drive himself. Patient stated that he become confused and agitated yesterday and also felt safety sitter is trying to harm him.  Patient feels some much clearer today and apologized for his behavior. Patient stated he thought he will be leaving yesterday from the hospital but confused about staff members saying he is not ready to leave. Reportedly patient daughter is concerned about his increased agitation both at home and while in the hospital. Patient is hoping to go home soon today. Patient knows his first name, last name and date of birth and current year which is 2016 but confused about rest of the questions asked about orientation. Patient memory is 2 out of the 3 for both the immediate and delayed. Patient has fund of knowledge he knows was depressed and definitely states and who wants to be depressed and definitely states from [employer]. Patient does not remember names of the people in public and party contesting for President but statss he wish one of them in the public and party won't get nomination to be President, but he could not name the candidate. Reportedly he had a third grade education and a hard worker in the past.   81 y/o M resident of [place], [place] here on a visit to see his daughter in [place] w/ a knwon history of CAD with stent X2 2011, CVA 2012?, HTN, DM, HLD, and morbid obesity who presented to [facility] c/o shortness of breath. He admitted he had been noncompliant with usual heart healthy diet for 3 days and had forgotten his medications at home in [place]. The patient has become severely agitated and delirious over the last 24-36 hours. He is currently urinating on the floor (during my visit), and has pulled out numerous IVs as well  as his foley cath x2. He is not oriented to place or situation, and appears to believe the staff are trying to mislead him. His family has noted that this behavior is not entirely new, and state that he has a hx of "some kind of psychiatric disease" though futher details are not clear. The family has attempted to pursue outpt w/u recently, but was  unable to do so due to noncompliance on behalf of the pt.   HPI Elements:   Location:  Agitation. Quality:  Fair. Severity:  Transient confusion and agitation. Timing:  Acute hospitalization. Duration:  2-3 days. Context:  Unknown psychosocial stresses.  Past Medical History:  Past Medical History  Diagnosis Date  . Stroke     left x 2  . Hypertension   . Diastolic CHF, chronic    . Atrial flutter   . UGIB (upper gastrointestinal bleed) 2009    duodenal ulcer  . OSA (obstructive sleep apnea)   . CKD (chronic kidney disease) stage 3, GFR 30-59 ml/min   . Sinus bradycardia     Not on BB  . Obesity   . Diabetes mellitus type 2, insulin dependent     Past Surgical History  Procedure Laterality Date  . Cardiac catheterization  06/2009    Dr Meda Coffee Seen, [facility] in [place]. Lmain 50%, LAD 50% (FFR 92), RI 90% (small), CFX 40%, OM 100% w/ L-L collaterals, RCA 40%, PDA 50% EF 45%. Med rx  . Prostatectomy    . US echocardiography  2015    EF 50-55%, mod-severe LVH, mild-mod MR, nl RV pressures, nl atrial size, Grade 1 diastolic dysfunction  . Left heart catheterization with coronary angiogram N/A 11/16/2014    Procedure: LEFT HEART CATHETERIZATION WITH CORONARY ANGIOGRAM;  Surgeon: Leonie Man, MD;  Location: [facility] CATH LAB;  Service: Cardiovascular;  Laterality: N/A;  . Percutaneous coronary stent intervention (pci-s)  11/16/2014    Procedure: PERCUTANEOUS CORONARY STENT INTERVENTION (PCI-S);  Surgeon: Leonie Man, MD;  Location: [facility] CATH LAB;  Service: Cardiovascular;;  prox RCA     DES   Family History:  Family History  Problem Relation Age of Onset  . Hypertension Mother   . Diabetes    . Cancer Mother   . Cancer Father    Social History:  History  Alcohol Use No     History  Drug Use No    History   Social History  . Marital Status: Married    Spouse Name: N/A  . Number of Children: N/A  . Years of Education: N/A   Occupational History  . Retired     Social History Main Topics  . Smoking status: Former Research scientist (life sciences)  . Smokeless tobacco: Former Systems developer  . Alcohol Use: No  . Drug Use: No  . Sexual Activity: Not on file   Other Topics Concern  . None   Social History Narrative   Lives in [place]   Son visits doesn't currently drive      Poor vision after stroke circa 2013   Does most of his ADLs for himself   Additional Social History:                          Allergies:  No Known Allergies  Labs:  Results for orders placed or performed during the hospital encounter of 11/12/14 (from the past 48 hour(s))  Glucose, capillary     Status: Abnormal   Collection Time: 11/15/14  11:54 AM  Result Value Ref Range   Glucose-Capillary 119 (H) 70 - 99 mg/dL  Glucose, capillary     Status: Abnormal   Collection Time: 11/15/14  4:14 PM  Result Value Ref Range   Glucose-Capillary 120 (H) 70 - 99 mg/dL  Urinalysis, Routine w reflex microscopic     Status: Abnormal   Collection Time: 11/15/14  4:30 PM  Result Value Ref Range   Color, Urine RED (A) YELLOW    Comment: BIOCHEMICALS MAY BE AFFECTED BY COLOR   APPearance TURBID (A) CLEAR   Specific Gravity, Urine 1.022 1.005 - 1.030   pH 5.0 5.0 - 8.0   Glucose, UA NEGATIVE NEGATIVE mg/dL   Hgb urine dipstick LARGE (A) NEGATIVE   Bilirubin Urine LARGE (A) NEGATIVE   Ketones, ur 40 (A) NEGATIVE mg/dL   Protein, ur 100 (A) NEGATIVE mg/dL   Urobilinogen, UA 1.0 0.0 - 1.0 mg/dL   Nitrite POSITIVE (A) NEGATIVE   Leukocytes, UA MODERATE (A) NEGATIVE  Urine microscopic-add on     Status: Abnormal   Collection Time: 11/15/14  4:30 PM  Result Value Ref Range   Squamous Epithelial / LPF RARE RARE   WBC, UA 11-20 <3 WBC/hpf   RBC / HPF TOO NUMEROUS TO COUNT <3 RBC/hpf   Bacteria, UA FEW (A) RARE   Casts HYALINE CASTS (A) NEGATIVE    Comment: GRANULAR CAST   Urine-Other LESS THAN 10 mL OF URINE SUBMITTED   Glucose, capillary     Status: Abnormal   Collection Time: 11/15/14  8:15 PM   Result Value Ref Range   Glucose-Capillary 201 (H) 70 - 99 mg/dL  CBC     Status: Abnormal   Collection Time: 11/16/14  3:24 AM  Result Value Ref Range   WBC 7.0 4.0 - 10.5 K/uL   RBC 4.31 4.22 - 5.81 MIL/uL   Hemoglobin 12.3 (L) 13.0 - 17.0 g/dL   HCT 39.6 39.0 - 52.0 %   MCV 91.9 78.0 - 100.0 fL   MCH 28.5 26.0 - 34.0 pg   MCHC 31.1 30.0 - 36.0 g/dL   RDW 13.4 11.5 - 15.5 %   Platelets 323 150 - 400 K/uL  Heparin level (unfractionated)     Status: None   Collection Time: 11/16/14  3:24 AM  Result Value Ref Range   Heparin Unfractionated 0.37 0.30 - 0.70 IU/mL    Comment:        IF HEPARIN RESULTS ARE BELOW EXPECTED VALUES, AND PATIENT DOSAGE HAS BEEN CONFIRMED, SUGGEST FOLLOW UP TESTING OF ANTITHROMBIN III LEVELS.   Basic metabolic panel     Status: Abnormal   Collection Time: 11/16/14  3:24 AM  Result Value Ref Range   Sodium 143 135 - 145 mmol/L   Potassium 4.3 3.5 - 5.1 mmol/L   Chloride 105 96 - 112 mmol/L   CO2 28 19 - 32 mmol/L   Glucose, Bld 78 70 - 99 mg/dL   BUN 21 6 - 23 mg/dL   Creatinine, Ser 1.69 (H) 0.50 - 1.35 mg/dL   Calcium 8.9 8.4 - 10.5 mg/dL   GFR calc non Af Amer 37 (L) >90 mL/min   GFR calc Af Amer 42 (L) >90 mL/min    Comment: (NOTE) The eGFR has been calculated using the CKD EPI equation. This calculation has not been validated in all clinical situations. eGFR's persistently <90 mL/min signify possible Chronic Kidney Disease.    Anion gap 10 5 - 15  Glucose, capillary  Status: Abnormal   Collection Time: 11/16/14  7:37 AM  Result Value Ref Range   Glucose-Capillary 63 (L) 70 - 99 mg/dL   Comment 1 Notify RN    Comment 2 Document in Chart   Glucose, capillary     Status: None   Collection Time: 11/16/14  8:12 AM  Result Value Ref Range   Glucose-Capillary 81 70 - 99 mg/dL   Comment 1 Notify RN    Comment 2 Document in Chart   Hemoglobin A1c     Status: Abnormal   Collection Time: 11/16/14 10:45 AM  Result Value Ref Range   Hgb  A1c MFr Bld 7.7 (H) 4.8 - 5.6 %    Comment: (NOTE)         Pre-diabetes: 5.7 - 6.4         Diabetes: >6.4         Glycemic control for adults with diabetes: <7.0    Mean Plasma Glucose 174 mg/dL    Comment: (NOTE) Performed At: [facility] [place], [place] [id number] Lindon Romp MD [id number]   Lipid panel     Status: Abnormal   Collection Time: 11/16/14 10:45 AM  Result Value Ref Range   Cholesterol 91 0 - 200 mg/dL   Triglycerides 51 <150 mg/dL   HDL 28 (L) >39 mg/dL   Total CHOL/HDL Ratio 3.3 RATIO   VLDL 10 0 - 40 mg/dL   LDL Cholesterol 53 0 - 99 mg/dL    Comment:        Total Cholesterol/HDL:CHD Risk Coronary Heart Disease Risk Table                     Men   Women  1/2 Average Risk   3.4   3.3  Average Risk       5.0   4.4  2 X Average Risk   9.6   7.1  3 X Average Risk  23.4   11.0        Use the calculated Patient Ratio above and the CHD Risk Table to determine the patient's CHD Risk.        ATP III CLASSIFICATION (LDL):  <100     mg/dL   Optimal  100-129  mg/dL   Near or Above                    Optimal  130-159  mg/dL   Borderline  160-189  mg/dL   High  >190     mg/dL   Very High   Glucose, capillary     Status: Abnormal   Collection Time: 11/16/14 11:53 AM  Result Value Ref Range   Glucose-Capillary 47 (L) 70 - 99 mg/dL   Comment 1 Notify RN    Comment 2 Document in Chart   Glucose, capillary     Status: Abnormal   Collection Time: 11/16/14 11:55 AM  Result Value Ref Range   Glucose-Capillary 38 (LL) 70 - 99 mg/dL   Comment 1 Notify RN    Comment 2 Document in Chart   Glucose, capillary     Status: None   Collection Time: 11/16/14 12:31 PM  Result Value Ref Range   Glucose-Capillary 71 70 - 99 mg/dL  Glucose, capillary     Status: Abnormal   Collection Time: 11/16/14  3:22 PM  Result Value Ref Range   Glucose-Capillary 52 (L) 70 - 99 mg/dL  Glucose, capillary  Status: None   Collection Time: 11/16/14  3:46  PM  Result Value Ref Range   Glucose-Capillary 88 70 - 99 mg/dL  POCT Activated clotting time     Status: None   Collection Time: 11/16/14  4:35 PM  Result Value Ref Range   Activated Clotting Time 239 seconds  POCT Activated clotting time     Status: None   Collection Time: 11/16/14  5:02 PM  Result Value Ref Range   Activated Clotting Time 294 seconds  POCT Activated clotting time     Status: None   Collection Time: 11/16/14  5:45 PM  Result Value Ref Range   Activated Clotting Time 288 seconds  Glucose, capillary     Status: Abnormal   Collection Time: 11/16/14  6:04 PM  Result Value Ref Range   Glucose-Capillary 43 (LL) 70 - 99 mg/dL  Glucose, capillary     Status: Abnormal   Collection Time: 11/16/14  6:08 PM  Result Value Ref Range   Glucose-Capillary 63 (L) 70 - 99 mg/dL  Glucose, capillary     Status: None   Collection Time: 11/16/14  6:32 PM  Result Value Ref Range   Glucose-Capillary 95 70 - 99 mg/dL  Glucose, capillary     Status: Abnormal   Collection Time: 11/16/14  9:40 PM  Result Value Ref Range   Glucose-Capillary 130 (H) 70 - 99 mg/dL   Comment 1 Notify RN    Comment 2 Document in Chart   Glucose, capillary     Status: None   Collection Time: 11/17/14  3:41 AM  Result Value Ref Range   Glucose-Capillary 92 70 - 99 mg/dL   Comment 1 Notify RN    Comment 2 Document in Chart   CBC     Status: Abnormal   Collection Time: 11/17/14  3:45 AM  Result Value Ref Range   WBC 6.1 4.0 - 10.5 K/uL   RBC 4.20 (L) 4.22 - 5.81 MIL/uL   Hemoglobin 12.0 (L) 13.0 - 17.0 g/dL   HCT 38.4 (L) 39.0 - 52.0 %   MCV 91.4 78.0 - 100.0 fL   MCH 28.6 26.0 - 34.0 pg   MCHC 31.3 30.0 - 36.0 g/dL   RDW 13.6 11.5 - 15.5 %   Platelets 320 150 - 400 K/uL  Basic metabolic panel     Status: Abnormal   Collection Time: 11/17/14  3:45 AM  Result Value Ref Range   Sodium 143 135 - 145 mmol/L   Potassium 4.4 3.5 - 5.1 mmol/L   Chloride 108 96 - 112 mmol/L   CO2 29 19 - 32 mmol/L    Glucose, Bld 89 70 - 99 mg/dL   BUN 14 6 - 23 mg/dL   Creatinine, Ser 1.54 (H) 0.50 - 1.35 mg/dL   Calcium 8.9 8.4 - 10.5 mg/dL   GFR calc non Af Amer 41 (L) >90 mL/min   GFR calc Af Amer 47 (L) >90 mL/min    Comment: (NOTE) The eGFR has been calculated using the CKD EPI equation. This calculation has not been validated in all clinical situations. eGFR's persistently <90 mL/min signify possible Chronic Kidney Disease.    Anion gap 6 5 - 15    Vitals: Blood pressure 126/54, pulse 70, temperature 98.3 F (36.8 C), temperature source Oral, resp. rate 14, height $RemoveBe'5\' 10"'kQokpbfeb$  (1.778 m), weight 111 kg (244 lb 11.4 oz), SpO2 96 %.  Risk to Self: Is patient at risk for suicide?: No Risk to Others:  Prior Inpatient Therapy:   Prior Outpatient Therapy:    Current Facility-Administered Medications  Medication Dose Route Frequency Provider Last Rate Last Dose  . amLODipine (NORVASC) tablet 10 mg  10 mg Oral Daily Nita Sells, MD   10 mg at 11/16/14 0950  . clopidogrel (PLAVIX) tablet 75 mg  75 mg Oral Q breakfast Leonie Man, MD      . dipyridamole-aspirin [facility]) 200-25 MG per 12 hr capsule 1 capsule  1 capsule Oral BID Nita Sells, MD   1 capsule at 11/16/14 0950  . haloperidol lactate (HALDOL) injection 2 mg  2 mg Intravenous Q6H PRN Cherene Altes, MD      . haloperidol lactate (HALDOL) injection 5 mg  5 mg Intramuscular Once Cherene Altes, MD      . heparin injection 5,000 Units  5,000 Units Subcutaneous 3 times per day Cherene Altes, MD      . hydrALAZINE (APRESOLINE) tablet 50 mg  50 mg Oral TID Nita Sells, MD   50 mg at 11/15/14 2151  . insulin aspart (novoLOG) injection 0-9 Units  0-9 Units Subcutaneous TID WC Nita Sells, MD   1 Units at 11/14/14 1800  . insulin glargine (LANTUS) injection 20 Units  20 Units Subcutaneous QHS Cherene Altes, MD      . isosorbide mononitrate (IMDUR) 24 hr tablet 30 mg  30 mg Oral Daily Troy Sine, MD    30 mg at 11/16/14 0950  . simvastatin (ZOCOR) tablet 20 mg  20 mg Oral q1800 Nita Sells, MD   20 mg at 11/15/14 1842    Musculoskeletal: Strength & Muscle Tone: decreased Gait & Station: unable to stand Patient leans: N/A  Psychiatric Specialty Exam: Physical Exam as per history and physical   ROS problem with cognition, memory and concentration   Blood pressure 126/54, pulse 70, temperature 98.3 F (36.8 C), temperature source Oral, resp. rate 14, height $RemoveBe'5\' 10"'efCCqIepq$  (1.778 m), weight 111 kg (244 lb 11.4 oz), SpO2 96 %.Body mass index is 35.11 kg/(m^2).  General Appearance: Casual  Eye Contact::  Good  Speech:  Clear and Coherent  Volume:  Normal  Mood:  Anxious  Affect:  Appropriate and Congruent  Thought Process:  Coherent and Goal Directed  Orientation:  Full (Time, Place, and Person)  Thought Content:  WDL  Suicidal Thoughts:  No  Homicidal Thoughts:  No  Memory:  Immediate;   Fair Recent;   Poor  Judgement:  Intact  Insight:  Fair  Psychomotor Activity:  Normal  Concentration:  Fair  Recall:  Poor  Fund of Knowledge:Fair  Language: Good  Akathisia:  Negative  Handed:  Right  AIMS (if indicated):     Assets:  Communication Skills Desire for Improvement Financial Resources/Insurance Housing Intimacy Leisure Time Resilience Social Support  ADL's:  Intact  Cognition: Impaired,  Mild  Sleep:      Medical Decision Making: Review of Psycho-Social Stressors (1), Review or order clinical lab tests (1), New Problem, with no additional work-up planned (3), Review of Last Therapy Session (1), Review of Medication Regimen & Side Effects (2) and Review of New Medication or Change in Dosage (2)  Treatment Plan Summary: Daily contact with patient to assess and evaluate symptoms and progress in treatment and Medication management  Plan: Patient has capacity to make his own medical decisions based on my evaluation today Patient has significant cognitive decline mostly  due to memory problems Patient benefit from medication for dementia like Aricept  5 mg once a day  May discontinue antipsychotic medication to prevent extrapyramidal symptoms May minimize medication that causes sedation including benzodiazepines to prevent increased confusion and agitation Patient does not meet criteria for psychiatric inpatient admission. Supportive therapy provided about ongoing stressors. Appreciate psychiatric consultation and follow up as clinically required Please contact [phone] or [phone] if needs further assistance  Disposition: Patient will be referred to the outpatient medication management when medically stable.  Kota Ciancio,JANARDHAHA R. 11/17/2014 10:34 AM

## 2014-11-17 NOTE — Progress Notes (Signed)
Received patient at shift change he just arrived post cath right radial site. He was a little agitated and pulled Monitor partially off. Dr. Herbie BaltimoreHarding checked in on the patient and he clamed down.  Later into the deflation of radial band patient became more agitated and confused. Unsure if he has any underlying dementia at night. I tried to deflate his band several times and he would pull his arm away from and not let me touch him so it took longer then expected to deflate and remove band. I tried to reorient patient and not  disturb to much but agitation and confusion got worse. I requested safety sitter at bedside. He got out OOB went into hall would not listen to redirect him back to room. He is a hall fall risk unsteady on feet. He finally went into room and pull off condom cath and pushed staff as they tried to assist with him. He finally decided to lay down in the bed. I notified the MD on call he is aware and wants to be conservative on any type of restraint due to age and medical of patient and may make him worse. Patient is strong for age and large  and I think he would be hard to physically restraint. I notified patient  daughter Elita Quickam made her aware of the situation and she does not drive but will try to find a way to the hospital. Patient resting calmly in bed this moment if he has any more non compliance and pushing staff security will assist us I have called and updated them.

## 2014-11-18 DIAGNOSIS — Z9861 Coronary angioplasty status: Secondary | ICD-10-CM

## 2014-11-18 LAB — CBC
HEMATOCRIT: 38.6 % — AB (ref 39.0–52.0)
Hemoglobin: 12.4 g/dL — ABNORMAL LOW (ref 13.0–17.0)
MCH: 29 pg (ref 26.0–34.0)
MCHC: 32.1 g/dL (ref 30.0–36.0)
MCV: 90.2 fL (ref 78.0–100.0)
Platelets: 307 10*3/uL (ref 150–400)
RBC: 4.28 MIL/uL (ref 4.22–5.81)
RDW: 13.5 % (ref 11.5–15.5)
WBC: 6.6 10*3/uL (ref 4.0–10.5)

## 2014-11-18 LAB — COMPREHENSIVE METABOLIC PANEL
ALT: 24 U/L (ref 0–53)
AST: 28 U/L (ref 0–37)
Albumin: 2.9 g/dL — ABNORMAL LOW (ref 3.5–5.2)
Alkaline Phosphatase: 58 U/L (ref 39–117)
Anion gap: 9 (ref 5–15)
BILIRUBIN TOTAL: 0.8 mg/dL (ref 0.3–1.2)
BUN: 13 mg/dL (ref 6–23)
CALCIUM: 9.1 mg/dL (ref 8.4–10.5)
CO2: 28 mmol/L (ref 19–32)
CREATININE: 1.54 mg/dL — AB (ref 0.50–1.35)
Chloride: 104 mmol/L (ref 96–112)
GFR calc Af Amer: 47 mL/min — ABNORMAL LOW (ref >90)
GFR calc non Af Amer: 41 mL/min — ABNORMAL LOW (ref >90)
Glucose, Bld: 98 mg/dL (ref 70–99)
Potassium: 4.7 mmol/L (ref 3.5–5.1)
Sodium: 141 mmol/L (ref 135–145)
Total Protein: 7.2 g/dL (ref 6.0–8.3)

## 2014-11-18 LAB — GLUCOSE, CAPILLARY
GLUCOSE-CAPILLARY: 38 mg/dL — AB (ref 70–99)
GLUCOSE-CAPILLARY: 43 mg/dL — AB (ref 70–99)
GLUCOSE-CAPILLARY: 98 mg/dL (ref 70–99)

## 2014-11-18 LAB — RPR: RPR Ser Ql: NONREACTIVE

## 2014-11-18 LAB — FOLATE: FOLATE: 9.3 ng/mL

## 2014-11-18 LAB — HIV ANTIBODY (ROUTINE TESTING W REFLEX): HIV Screen 4th Generation wRfx: NONREACTIVE

## 2014-11-18 LAB — VITAMIN B12: Vitamin B-12: 725 pg/mL (ref 211–911)

## 2014-11-18 MED ORDER — FREESTYLE SYSTEM KIT: 1.0000 | PACK | Freq: Three times a day (TID) | Status: DC

## 2014-11-18 MED ORDER — INSULIN ASPART 100 UNIT/ML ~~LOC~~ SOLN: SUBCUTANEOUS | Status: AC

## 2014-11-18 MED ORDER — INSULIN GLARGINE 100 UNIT/ML SOLOSTAR PEN
15.0000 [IU] | PEN_INJECTOR | Freq: Every day | SUBCUTANEOUS | Status: AC
Start: 2014-11-18 — End: ?

## 2014-11-18 MED ORDER — "INSULIN SYRINGE-NEEDLE U-100 25G X 1"" 1 ML MISC": Status: DC

## 2014-11-18 MED ORDER — CLOPIDOGREL BISULFATE 75 MG PO TABS: 75.0000 mg | ORAL_TABLET | Freq: Every day | ORAL | Status: AC

## 2014-11-18 MED ORDER — ASPIRIN EC 81 MG PO TBEC
81.0000 mg | DELAYED_RELEASE_TABLET | Freq: Every day | ORAL | Status: DC
  Administered 2014-11-18: 81 mg via ORAL
  Filled 2014-11-18: qty 1

## 2014-11-18 MED ORDER — ISOSORBIDE MONONITRATE ER 60 MG PO TB24: 60.0000 mg | ORAL_TABLET | Freq: Every day | ORAL | Status: AC

## 2014-11-18 MED ORDER — ASPIRIN 81 MG PO TBEC: 81.0000 mg | DELAYED_RELEASE_TABLET | Freq: Every day | ORAL | Status: AC

## 2014-11-18 MED ORDER — METOPROLOL TARTRATE 25 MG PO TABS: 25.0000 mg | ORAL_TABLET | Freq: Two times a day (BID) | ORAL | Status: AC

## 2014-11-18 NOTE — Discharge Summary (Signed)
Brendan Torres, is a 81 y.o. male  DOB Aug 28, 1931  MRN [id number].  Admission date:  11/12/2014  Admitting Physician  Nita Sells, MD  Discharge Date:  11/18/2014   Primary MD  Jolene Provost, MD  Recommendations for primary care physician for things to follow:    Check CBC, BMP in 1 week. Follow glycemic control and secondary risk factors for CAD closely.  Outpatient cardiology and psychiatry follow-up. One-time outpatient renal follow-up.   Admission Diagnosis  Dyspnea [R06.00] Acute coronary syndrome [I24.9] Elevated troponin [R79.89]   Discharge Diagnosis  Dyspnea [R06.00] Acute coronary syndrome [I24.9] Elevated troponin [R79.89]     Principal Problem:   Dyspnea Active Problems:   Elevated troponin   CVA, old, hemiparesis   Diabetes mellitus ty 2   HTN (hypertension)   Obesity (BMI 30.0-34.9)   Tremor   Hematuria, gross   Acute coronary syndrome   Acute renal injury   CAD S/P PCI 2011, RCA BMS 11/16/14   Acute delirium      Past Medical History  Diagnosis Date  . Stroke     left x 2  . Hypertension   . Diastolic CHF, chronic    . Atrial flutter   . UGIB (upper gastrointestinal bleed) 2009    duodenal ulcer  . OSA (obstructive sleep apnea)   . CKD (chronic kidney disease) stage 3, GFR 30-59 ml/min   . Sinus bradycardia     Not on BB  . Obesity   . Diabetes mellitus type 2, insulin dependent     Past Surgical History  Procedure Laterality Date  . Cardiac catheterization  06/2009    Dr Meda Coffee Seen, [facility] in [place]. Lmain 50%, LAD 50% (FFR 92), RI 90% (small), CFX 40%, OM 100% w/ L-L collaterals, RCA 40%, PDA 50% EF 45%. Med rx  . Prostatectomy    . US echocardiography  2015    EF 50-55%, mod-severe LVH, mild-mod MR, nl RV pressures, nl atrial size, Grade 1  diastolic dysfunction  . Left heart catheterization with coronary angiogram N/A 11/16/2014    Procedure: LEFT HEART CATHETERIZATION WITH CORONARY ANGIOGRAM;  Surgeon: Leonie Man, MD;  Location: [facility] CATH LAB;  Service: Cardiovascular;  Laterality: N/A;  . Percutaneous coronary stent intervention (pci-s)  11/16/2014    Procedure: PERCUTANEOUS CORONARY STENT INTERVENTION (PCI-S);  Surgeon: Leonie Man, MD;  Location: [facility] CATH LAB;  Service: Cardiovascular;;  prox RCA     DES       History of present illness and  Hospital Course:     Kindly see H&P for history of present illness and admission details, please review complete Labs, Consult reports and Test reports for all details in brief  HPI  The patient has become severely agitated and delirious over the last 24-36 hours. He is currently urinating on the floor (during my visit), and has pulled out numerous IVs as well as his foley cath x2. He is not oriented to place or situation, and appears to believe the staff are trying  to mislead him. His family has noted that this behavior is not entirely new, and state that he has a hx of "some kind of psychiatric disease" though futher details are not clear. The family has attempted to pursue outpt w/u recently, but was unable to do so due to noncompliance on behalf of the pt.    Hospital Course    Severe multivessel CAD - Elevated troponin w/o chest pain  Trop peaked at 0.78 - cardiac cath 4/19 revealed 100% CTO of the Circumflex, 90% proximal Ramus, 95% proximal RCA with 100% proximal RPL2 along with moderate to severe mid LAD disease - pt underwent bare metal stent placement in RCA lesion - will be placed on aspirin and Plavix, close outpatient follow-up with cardiology. Continue beta blocker, Imdur and statin. Completely symptom free.  Acute on CKD (stage 3, GFR 30-59 ml/min at baseline) Continue to hold diuretic and ACE inhibitor - w/ hydration his crt has returned to his baseline,  requests BCP to repeat BMP in a week and consider one-time outpatient renal follow-up.  Acute diastolic CHF exacerbation causing acute hypoxic respiratory failure Continue euvolemic and symptom-free, resume home dose of Lasix upon discharge monitor creatinine, we can diuretic dose closely.   Mildly elevated d-dimer venous duplex bilateral lower extremities without evidence of DVT - further eval not presently indicated   CVA, old, hemiparesis Now on aspirin and Plavix and statin  Diabetes mellitus 2  ? compliance with diet and regimen at home, he was hypoglycemic on his home regimen. Have reduced his Lantus to 15 units at night along with sliding scale. Testing supplies provided. Requested to do Accu-Cheks every before meals and at bedtime and maintain a logbook for PCP. Lab Results  Component Value Date   HGBA1C 7.7* 11/16/2014    CBG (last 3)   Recent Labs  11/17/14 0341 11/17/14 1724 11/18/14 0737  GLUCAP 92 117* 98      HTN Blood pressure currently well controlled on present regimen request PCP to monitor closely.   Obesity - Body mass index is 35.11 kg/(m^2).    Hx of OSA noncompliant w/ CPAP    Hematuria Due to traumatic removal of Foley catheter x2 per patient - currently says hematuria has resolved and does not want any further workup.   Severe acute agitation - Psychosis v/s acute delirium Seen by psychiatry and deemed competent, cleared for home discharge. He is calm and quiet this morning. Very cooperative. In no distress. He says he is just angry with his family. Denies any suicidal or homicidal ideations. Requests PCP to consider one-time outpatient psych follow-up if agitation becomes an issue in the future.    Discharge Condition: Stable   Follow UP  Follow-up Information    Follow up with SIDHU,AMANJOT, MD. Schedule an appointment as soon as possible for a visit in 1 week.   Specialty:  Internal Medicine   Why:  and your Psychiatrist    Contact information:   [address] [place] [address] [phone]       Follow up with [facility] [address] Schedule an appointment as soon as possible for a visit in 1 week.   Why:  CAD-stent placement   Contact information:   [address] [place] [address]         Discharge Instructions  and  Discharge Medications          Discharge Instructions    Amb Referral to Cardiac Rehabilitation  Complete by:  As directed      Discharge instructions    Complete by:  As directed   Follow with Primary MD SIDHU,AMANJOT, MD in 7 days   Get CBC, CMP, 2 view Chest X ray checked  by Primary MD next visit.    Activity: As tolerated with Full fall precautions use walker/cane & assistance as needed   Disposition Home     Diet: Heart Healthy Low carb.  Accuchecks 4 times/day, Once in AM empty stomach and then before each meal. Log in all results and show them to your Prim.MD in 3 days. If any glucose reading is under 80 or above 300 call your Prim MD immidiately. Follow Low glucose instructions for glucose under 80 as instructed.   For Heart failure patients - Check your Weight same time everyday, if you gain over 2 pounds, or you develop in leg swelling, experience more shortness of breath or chest pain, call your Primary MD immediately. Follow Cardiac Low Salt Diet and 1.5 lit/day fluid restriction.   On your next visit with your primary care physician please Get Medicines reviewed and adjusted.   Please request your Prim.MD to go over all Hospital Tests and Procedure/Radiological results at the follow up, please get all Hospital records sent to your Prim MD by signing hospital release before you go home.   If you experience worsening of your admission symptoms, develop shortness of breath, life threatening emergency, suicidal or homicidal thoughts you must seek medical attention immediately by calling 911 or calling your MD  immediately  if symptoms less severe.  You Must read complete instructions/literature along with all the possible adverse reactions/side effects for all the Medicines you take and that have been prescribed to you. Take any new Medicines after you have completely understood and accpet all the possible adverse reactions/side effects.   Do not drive, operating heavy machinery, perform activities at heights, swimming or participation in water activities or provide baby sitting services if your were admitted for syncope or siezures until you have seen by Primary MD or a Neurologist and advised to do so again.  Do not drive when taking Pain medications.    Do not take more than prescribed Pain, Sleep and Anxiety Medications  Special Instructions: If you have smoked or chewed Tobacco  in the last 2 yrs please stop smoking, stop any regular Alcohol  and or any Recreational drug use.  Wear Seat belts while driving.   Please note  You were cared for by a hospitalist during your hospital stay. If you have any questions about your discharge medications or the care you received while you were in the hospital after you are discharged, you can call the unit and asked to speak with the hospitalist on call if the hospitalist that took care of you is not available. Once you are discharged, your primary care physician will handle any further medical issues. Please note that NO REFILLS for any discharge medications will be authorized once you are discharged, as it is imperative that you return to your primary care physician (or establish a relationship with a primary care physician if you do not have one) for your aftercare needs so that they can reassess your need for medications and monitor your lab values.      Radial Site Care Refer to this sheet in the next few weeks. These instructions provide you with information on caring for yourself after your procedure. Your caregiver may also give you more specific  instructions. Your treatment has been planned according to current medical practices, but problems sometimes occur. Call your caregiver if you have any problems or questions after your procedure. HOME CARE INSTRUCTIONS You may shower the day after the procedure.Remove the bandage (dressing) and gently wash the site with plain soap and water.Gently pat the site dry. Do not apply powder or lotion to the site. Do not submerge the affected site in water for 3 to 5 days. Inspect the site at least twice daily. Do not flex or bend the affected arm for 24 hours. No lifting over 5 pounds (2.3 kg) for 5 days after your procedure. Do not drive home if you are discharged the same day of the procedure. Have someone else drive you. You may drive 24 hours after the procedure unless otherwise instructed by your caregiver. Do not operate machinery or power tools for 24 hours. A responsible adult should be with you for the first 24 hours after you arrive home. What to expect: Any bruising will usually fade within 1 to 2 weeks. Blood that collects in the tissue (hematoma) may be painful to the touch. It should usually decrease in size and tenderness within 1 to 2 weeks. SEEK IMMEDIATE MEDICAL CARE IF: You have unusual pain at the radial site. You have redness, warmth, swelling, or pain at the radial site. You have drainage (other than a small amount of blood on the dressing). You have chills. You have a fever or persistent symptoms for more than 72 hours. You have a fever and your symptoms suddenly get worse. Your arm becomes pale, cool, tingly, or numb. You have heavy bleeding from the site. Hold pressure on the site. Document Released: 08/18/2010 Document Revised: 10/08/2011 Document Reviewed: 08/18/2010 [facility] Patient Information 2015 [place], [place]. This information is not intended to replace advice given to you by your health care provider. Make sure you discuss any questions you have with your  health care provider.     Increase activity slowly    Complete by:  As directed             Medication List    STOP taking these medications        dipyridamole-aspirin 200-25 MG per 12 hr capsule  Commonly known as:  AGGRENOX     lisinopril 40 MG tablet  Commonly known as:  PRINIVIL,ZESTRIL      TAKE these medications        amLODipine 10 MG tablet  Commonly known as:  NORVASC  Take 10 mg by mouth daily.     aspirin 81 MG EC tablet  Take 1 tablet (81 mg total) by mouth daily.     clopidogrel 75 MG tablet  Commonly known as:  PLAVIX  Take 1 tablet (75 mg total) by mouth daily with breakfast.     furosemide 40 MG tablet  Commonly known as:  LASIX  Take 40 mg by mouth daily.     glucose monitoring kit monitoring kit  1 each by Does not apply route 4 (four) times daily - after meals and at bedtime. 1 month Diabetic Testing Supplies for QAC-QHS accuchecks.Any brand OK     hydrALAZINE 50 MG tablet  Commonly known as:  APRESOLINE  Take 50 mg by mouth 3 (three) times daily.     insulin aspart 100 UNIT/ML injection  Commonly known as:  NOVOLOG  - Before each meal 3 times a day, 140-199 - 2 units, 200-250 - 4 units, 251-299 - 6 units,  300-349 - 8 units,  350 or above 10 units.  - Dispense syringes and needles as needed, Ok to switch to PEN if approved.     Insulin Glargine 100 UNIT/ML Solostar Pen  Commonly known as:  LANTUS  Inject 15 Units into the skin daily at 10 pm.     Insulin Syringe-Needle U-100 25G X 1" 1 ML Misc  Any brand, for 4 times a day insulin SQ, 1 month supply.     isosorbide mononitrate 60 MG 24 hr tablet  Commonly known as:  IMDUR  Take 1 tablet (60 mg total) by mouth daily.     metoprolol tartrate 25 MG tablet  Commonly known as:  LOPRESSOR  Take 1 tablet (25 mg total) by mouth 2 (two) times daily.     potassium chloride 10 MEQ tablet  Commonly known as:  K-DUR,KLOR-CON  Take 10 mEq by mouth daily.     simvastatin 20 MG tablet   Commonly known as:  ZOCOR  Take 20 mg by mouth daily.          Diet and Activity recommendation: See Discharge Instructions above   Consults obtained - Cards, Psych   Major procedures and Radiology Reports - PLEASE review detailed and final reports for all details, in brief -     TTE - 4/17 - mild LVH, ejection fraction 55-60%, akinesis of the inferolateral myocardium, grade 1 diastolic dysfunction Cardiac cath w/ Bare Metal RCA stent placement - 4/19  Dg Chest 2 View (if Patient Has Fever And/or Copd)  11/12/2014   CLINICAL DATA:  Shortness of breath  EXAM: CHEST  2 VIEW  COMPARISON:  None.  FINDINGS: There is mild cardiomegaly. The aorta is tortuous and prominent in caliber. Mild interstitial coarsening without edema or pneumonia. No effusion or pneumothorax. No acute osseous findings.  IMPRESSION: 1. No pneumonia or edema. 2. Mild cardiomegaly. 3. Tortuous and likely dilated aorta.   Electronically Signed   By: Monte Fantasia M.D.   On: 11/12/2014 04:29    Micro Results      Recent Results (from the past 240 hour(s))  MRSA PCR Screening     Status: None   Collection Time: 11/12/14  8:26 AM  Result Value Ref Range Status   MRSA by PCR NEGATIVE NEGATIVE Final    Comment:        The GeneXpert MRSA Assay (FDA approved for NASAL specimens only), is one component of a comprehensive MRSA colonization surveillance program. It is not intended to diagnose MRSA infection nor to guide or monitor treatment for MRSA infections.        Today   Subjective:   Brendan Torres today has no headache,no chest abdominal pain,no new weakness tingling or numbness, feels much better wants to go home today.    Objective:   Blood pressure 137/73, pulse 60, temperature 98.5 F (36.9 C), temperature source Oral, resp. rate 17, height $RemoveBe'5\' 10"'bRqyrupia$  (1.778 m), weight 112.5 kg (248 lb 0.3 oz), SpO2 94 %.   Intake/Output Summary (Last 24 hours) at 11/18/14 1316 Last data filed at 11/18/14  0541  Gross per 24 hour  Intake    120 ml  Output   1300 ml  Net  -1180 ml    Exam Awake Alert, Oriented x 3, No new F.N deficits, Normal affect [place].AT,PERRAL Supple Neck,No JVD, No cervical lymphadenopathy appriciated.  Symmetrical Chest wall movement, Good air movement bilaterally, CTAB RRR,No Gallops,Rubs or new Murmurs, No Parasternal Heave +ve B.Sounds, Abd Soft, Non  tender, No organomegaly appriciated, No rebound -guarding or rigidity. No Cyanosis, Clubbing or edema, No new Rash or bruise  Data Review   CBC w Diff:  Lab Results  Component Value Date   WBC 6.6 11/18/2014   HGB 12.4* 11/18/2014   HCT 38.6* 11/18/2014   PLT 307 11/18/2014    CMP:  Lab Results  Component Value Date   NA 141 11/18/2014   K 4.7 11/18/2014   CL 104 11/18/2014   CO2 28 11/18/2014   BUN 13 11/18/2014   CREATININE 1.54* 11/18/2014   PROT 7.2 11/18/2014   ALBUMIN 2.9* 11/18/2014   BILITOT 0.8 11/18/2014   ALKPHOS 58 11/18/2014   AST 28 11/18/2014   ALT 24 11/18/2014  . Lab Results  Component Value Date   HGBA1C 7.7* 11/16/2014    Lab Results  Component Value Date   CHOL 91 11/16/2014   HDL 28* 11/16/2014   LDLCALC 53 11/16/2014   TRIG 51 11/16/2014   CHOLHDL 3.3 11/16/2014     Total Time in preparing paper work, data evaluation and todays exam - 35 minutes  Lala Lund K M.D on 11/18/2014 at 1:16 PM  [facility]   Office  [phone]

## 2014-11-18 NOTE — Consult Note (Signed)
Psychiatry Consult follow-up  Reason for Consult:  Capacity evaluation and acute delirium and dementia Referring Physician:  Dr. Thereasa Solo Patient Identification: Brendan Torres MRN:  [id number] Principal Diagnosis: Dyspnea, agitation Diagnosis:   Patient Active Problem List   Diagnosis Date Noted  . Acute delirium [R41.0] 11/17/2014  . CAD S/P PCI 2011, RCA BMS 11/16/14 [I25.10, Z98.61]   . Acute coronary syndrome [I24.9]   . Acute renal injury [N17.9]   . Hematuria, gross [R31.0]   . Dyspnea [R06.00]   . Elevated troponin [R79.89] 11/12/2014  . Acute heart failure, systolic [P29.5] 18/84/1660  . CVA, old, hemiparesis [I69.359] 11/12/2014  . Diabetes mellitus ty 2 [E11.9] 11/12/2014  . HTN (hypertension) [I10] 11/12/2014  . Obesity (BMI 30.0-34.9) [E66.9] 11/12/2014  . Former smoker [Y30.160] 11/12/2014  . Tremor [R25.1] 11/12/2014    Total Time spent with patient: 20 minutes  Subjective:   Brendan Torres is a 81 y.o. male patient admitted with agitated delirium.  HPI: Brendan Torres is a 81 years old male admitted to [facility] for shortness of breath and history of noncompliant with his medication management secondary to easily forgetful. Case discussed with staff RN, social worker and Dr. Thereasa Solo. Patient was seen sitting in a chair next to his bed with the safety sitter in his room. Reportedly patient has been visiting family here in [place] from [place]. Patient has a daughter in [place] and other daughter from [place] moved in to help him and his wife. Reportedly patient wife has been suffering with dementia. Patient remember having difficulty with his memory and not able to recall names. Patient reportedly taken to the doctors appointment by his daughter because he cannot drive himself. Patient stated that he become confused and agitated yesterday and also felt safety sitter is trying to harm him. Patient feels some much clearer today and apologized for his  behavior. Patient stated he thought he will be leaving yesterday from the hospital but confused about staff members saying he is not ready to leave. Reportedly patient daughter is concerned about his increased agitation both at home and while in the hospital. Patient is hoping to go home soon today. Patient knows his first name, last name and date of birth and current year which is 2016 but confused about rest of the questions asked about orientation. Patient memory is 2 out of the 3 for both the immediate and delayed. Patient has fund of knowledge he knows was depressed and definitely states and who wants to be depressed and definitely states from [employer]. Patient does not remember names of the people in public and party contesting for President but statss he wish one of them in the public and party won't get nomination to be President, but he could not name the candidate. Reportedly he had a third grade education and a hard worker in the past.   Interval history: Patient appeared calm, cooperative and pleasant during this evaluation. Patient has no reported complaints about behavioral or emotional problems. Patient endorses forgetfulness especially names and places. Staff who is at bedside reported no known difficulties since this morning. Patient is hoping his family will be coming and picking him up today. Patient contract for safety and does not required further psychiatric evaluation at this time.   Past Medical History:  Past Medical History  Diagnosis Date  . Stroke     left x 2  . Hypertension   . Diastolic CHF, chronic    . Atrial flutter   . UGIB (  upper gastrointestinal bleed) 2009    duodenal ulcer  . OSA (obstructive sleep apnea)   . CKD (chronic kidney disease) stage 3, GFR 30-59 ml/min   . Sinus bradycardia     Not on BB  . Obesity   . Diabetes mellitus type 2, insulin dependent     Past Surgical History  Procedure Laterality Date  . Cardiac catheterization  06/2009     Dr Meda Coffee Seen, [facility] in [place]. Lmain 50%, LAD 50% (FFR 92), RI 90% (small), CFX 40%, OM 100% w/ L-L collaterals, RCA 40%, PDA 50% EF 45%. Med rx  . Prostatectomy    . US echocardiography  2015    EF 50-55%, mod-severe LVH, mild-mod MR, nl RV pressures, nl atrial size, Grade 1 diastolic dysfunction  . Left heart catheterization with coronary angiogram N/A 11/16/2014    Procedure: LEFT HEART CATHETERIZATION WITH CORONARY ANGIOGRAM;  Surgeon: Leonie Man, MD;  Location: [facility] CATH LAB;  Service: Cardiovascular;  Laterality: N/A;  . Percutaneous coronary stent intervention (pci-s)  11/16/2014    Procedure: PERCUTANEOUS CORONARY STENT INTERVENTION (PCI-S);  Surgeon: Leonie Man, MD;  Location: [facility] CATH LAB;  Service: Cardiovascular;;  prox RCA     DES   Family History:  Family History  Problem Relation Age of Onset  . Hypertension Mother   . Diabetes    . Cancer Mother   . Cancer Father    Social History:  History  Alcohol Use No     History  Drug Use No    History   Social History  . Marital Status: Married    Spouse Name: N/A  . Number of Children: N/A  . Years of Education: N/A   Occupational History  . Retired    Social History Main Topics  . Smoking status: Former Research scientist (life sciences)  . Smokeless tobacco: Former Systems developer  . Alcohol Use: No  . Drug Use: No  . Sexual Activity: Not on file   Other Topics Concern  . None   Social History Narrative   Lives in [place]   Son visits doesn't currently drive      Poor vision after stroke circa 2013   Does most of his ADLs for himself   Additional Social History:                          Allergies:  No Known Allergies  Labs:  Results for orders placed or performed during the hospital encounter of 11/12/14 (from the past 48 hour(s))  Glucose, capillary     Status: Abnormal   Collection Time: 11/16/14 11:53 AM  Result Value Ref Range   Glucose-Capillary 47 (L) 70 - 99 mg/dL   Comment 1 Notify RN    Comment 2  Document in Chart   Glucose, capillary     Status: Abnormal   Collection Time: 11/16/14 11:55 AM  Result Value Ref Range   Glucose-Capillary 38 (LL) 70 - 99 mg/dL   Comment 1 Notify RN    Comment 2 Document in Chart   Glucose, capillary     Status: None   Collection Time: 11/16/14 12:31 PM  Result Value Ref Range   Glucose-Capillary 71 70 - 99 mg/dL  Glucose, capillary     Status: Abnormal   Collection Time: 11/16/14  3:22 PM  Result Value Ref Range   Glucose-Capillary 52 (L) 70 - 99 mg/dL  Glucose, capillary     Status: None   Collection  Time: 11/16/14  3:46 PM  Result Value Ref Range   Glucose-Capillary 88 70 - 99 mg/dL  POCT Activated clotting time     Status: None   Collection Time: 11/16/14  4:35 PM  Result Value Ref Range   Activated Clotting Time 239 seconds  POCT Activated clotting time     Status: None   Collection Time: 11/16/14  5:02 PM  Result Value Ref Range   Activated Clotting Time 294 seconds  POCT Activated clotting time     Status: None   Collection Time: 11/16/14  5:45 PM  Result Value Ref Range   Activated Clotting Time 288 seconds  Glucose, capillary     Status: Abnormal   Collection Time: 11/16/14  6:04 PM  Result Value Ref Range   Glucose-Capillary 43 (LL) 70 - 99 mg/dL  Glucose, capillary     Status: Abnormal   Collection Time: 11/16/14  6:08 PM  Result Value Ref Range   Glucose-Capillary 63 (L) 70 - 99 mg/dL  Glucose, capillary     Status: None   Collection Time: 11/16/14  6:32 PM  Result Value Ref Range   Glucose-Capillary 95 70 - 99 mg/dL  Glucose, capillary     Status: Abnormal   Collection Time: 11/16/14  9:40 PM  Result Value Ref Range   Glucose-Capillary 130 (H) 70 - 99 mg/dL   Comment 1 Notify RN    Comment 2 Document in Chart   Glucose, capillary     Status: None   Collection Time: 11/17/14  3:41 AM  Result Value Ref Range   Glucose-Capillary 92 70 - 99 mg/dL   Comment 1 Notify RN    Comment 2 Document in Chart   CBC     Status:  Abnormal   Collection Time: 11/17/14  3:45 AM  Result Value Ref Range   WBC 6.1 4.0 - 10.5 K/uL   RBC 4.20 (L) 4.22 - 5.81 MIL/uL   Hemoglobin 12.0 (L) 13.0 - 17.0 g/dL   HCT 38.4 (L) 39.0 - 52.0 %   MCV 91.4 78.0 - 100.0 fL   MCH 28.6 26.0 - 34.0 pg   MCHC 31.3 30.0 - 36.0 g/dL   RDW 13.6 11.5 - 15.5 %   Platelets 320 150 - 400 K/uL  Basic metabolic panel     Status: Abnormal   Collection Time: 11/17/14  3:45 AM  Result Value Ref Range   Sodium 143 135 - 145 mmol/L   Potassium 4.4 3.5 - 5.1 mmol/L   Chloride 108 96 - 112 mmol/L   CO2 29 19 - 32 mmol/L   Glucose, Bld 89 70 - 99 mg/dL   BUN 14 6 - 23 mg/dL   Creatinine, Ser 1.54 (H) 0.50 - 1.35 mg/dL   Calcium 8.9 8.4 - 10.5 mg/dL   GFR calc non Af Amer 41 (L) >90 mL/min   GFR calc Af Amer 47 (L) >90 mL/min    Comment: (NOTE) The eGFR has been calculated using the CKD EPI equation. This calculation has not been validated in all clinical situations. eGFR's persistently <90 mL/min signify possible Chronic Kidney Disease.    Anion gap 6 5 - 15  Ammonia     Status: Abnormal   Collection Time: 11/17/14  3:34 PM  Result Value Ref Range   Ammonia 40 (H) 11 - 32 umol/L  Vitamin B12     Status: None   Collection Time: 11/17/14  3:34 PM  Result Value Ref Range   Vitamin  B-12 725 211 - 911 pg/mL    Comment: Performed at [facility]  Folate     Status: None   Collection Time: 11/17/14  3:34 PM  Result Value Ref Range   Folate 9.3 ng/mL    Comment: (NOTE) Reference Ranges        Deficient:       0.4 - 3.3 ng/mL        Indeterminate:   3.4 - 5.4 ng/mL        Normal:              > 5.4 ng/mL Performed at [facility]   RPR     Status: None   Collection Time: 11/17/14  3:34 PM  Result Value Ref Range   RPR Ser Ql Non Reactive Non Reactive    Comment: (NOTE) Performed At: [facility] [address] [place], [place] [id number] Lindon Romp MD QG:[id number]   HIV antibody     Status: None    Collection Time: 11/17/14  3:34 PM  Result Value Ref Range   HIV Screen 4th Generation wRfx Non Reactive Non Reactive    Comment: (NOTE) Performed At: [facility], [place] [id number] Lindon Romp MD DI:[id number]   Glucose, capillary     Status: Abnormal   Collection Time: 11/17/14  5:24 PM  Result Value Ref Range   Glucose-Capillary 117 (H) 70 - 99 mg/dL  CBC     Status: Abnormal   Collection Time: 11/18/14  5:53 AM  Result Value Ref Range   WBC 6.6 4.0 - 10.5 K/uL   RBC 4.28 4.22 - 5.81 MIL/uL   Hemoglobin 12.4 (L) 13.0 - 17.0 g/dL   HCT 38.6 (L) 39.0 - 52.0 %   MCV 90.2 78.0 - 100.0 fL   MCH 29.0 26.0 - 34.0 pg   MCHC 32.1 30.0 - 36.0 g/dL   RDW 13.5 11.5 - 15.5 %   Platelets 307 150 - 400 K/uL  Comprehensive metabolic panel     Status: Abnormal   Collection Time: 11/18/14  5:53 AM  Result Value Ref Range   Sodium 141 135 - 145 mmol/L   Potassium 4.7 3.5 - 5.1 mmol/L   Chloride 104 96 - 112 mmol/L   CO2 28 19 - 32 mmol/L   Glucose, Bld 98 70 - 99 mg/dL   BUN 13 6 - 23 mg/dL   Creatinine, Ser 1.54 (H) 0.50 - 1.35 mg/dL   Calcium 9.1 8.4 - 10.5 mg/dL   Total Protein 7.2 6.0 - 8.3 g/dL   Albumin 2.9 (L) 3.5 - 5.2 g/dL   AST 28 0 - 37 U/L   ALT 24 0 - 53 U/L   Alkaline Phosphatase 58 39 - 117 U/L   Total Bilirubin 0.8 0.3 - 1.2 mg/dL   GFR calc non Af Amer 41 (L) >90 mL/min   GFR calc Af Amer 47 (L) >90 mL/min    Comment: (NOTE) The eGFR has been calculated using the CKD EPI equation. This calculation has not been validated in all clinical situations. eGFR's persistently <90 mL/min signify possible Chronic Kidney Disease.    Anion gap 9 5 - 15  Glucose, capillary     Status: None   Collection Time: 11/18/14  7:37 AM  Result Value Ref Range   Glucose-Capillary 98 70 - 99 mg/dL    Vitals: Blood pressure 137/73, pulse 60, temperature 98.5 F (36.9 C), temperature source Oral, resp. rate 17,  height '5\' 10"'$  (1.778 m), weight 112.5  kg (248 lb 0.3 oz), SpO2 94 %.  Risk to Self: Is patient at risk for suicide?: No Risk to Others:   Prior Inpatient Therapy:   Prior Outpatient Therapy:    Current Facility-Administered Medications  Medication Dose Route Frequency Provider Last Rate Last Dose  . amLODipine (NORVASC) tablet 10 mg  10 mg Oral Daily Nita Sells, MD   10 mg at 11/18/14 0811  . aspirin EC tablet 81 mg  81 mg Oral Daily Jerline Pain, MD   81 mg at 11/18/14 1610  . clopidogrel (PLAVIX) tablet 75 mg  75 mg Oral Q breakfast Leonie Man, MD   75 mg at 11/18/14 9604  . haloperidol lactate (HALDOL) injection 2 mg  2 mg Intravenous Q6H PRN Cherene Altes, MD   2 mg at 11/18/14 0810  . heparin injection 5,000 Units  5,000 Units Subcutaneous 3 times per day Cherene Altes, MD   5,000 Units at 11/17/14 1612  . hydrALAZINE (APRESOLINE) tablet 50 mg  50 mg Oral TID Nita Sells, MD   50 mg at 11/18/14 0811  . insulin aspart (novoLOG) injection 0-9 Units  0-9 Units Subcutaneous TID WC Nita Sells, MD   1 Units at 11/14/14 1800  . insulin glargine (LANTUS) injection 20 Units  20 Units Subcutaneous QHS Cherene Altes, MD   20 Units at 11/17/14 2200  . isosorbide mononitrate (IMDUR) 24 hr tablet 60 mg  60 mg Oral Daily Troy Sine, MD   60 mg at 11/18/14 0811  . metoprolol tartrate (LOPRESSOR) tablet 25 mg  25 mg Oral BID Troy Sine, MD   25 mg at 11/18/14 5409  . simvastatin (ZOCOR) tablet 20 mg  20 mg Oral q1800 Nita Sells, MD   20 mg at 11/17/14 1606    Musculoskeletal: Strength & Muscle Tone: decreased Gait & Station: unable to stand Patient leans: N/A  Psychiatric Specialty Exam: Physical Exam as per history and physical   ROS problem with cognition, memory and concentration   Blood pressure 137/73, pulse 60, temperature 98.5 F (36.9 C), temperature source Oral, resp. rate 17, height $RemoveBe'5\' 10"'lwFNYgFZL$  (1.778 m), weight 112.5 kg (248 lb 0.3 oz), SpO2 94 %.Body mass index is  35.59 kg/(m^2).  General Appearance: Casual  Eye Contact::  Good  Speech:  Clear and Coherent  Volume:  Normal  Mood:  Anxious  Affect:  Appropriate and Congruent  Thought Process:  Coherent and Goal Directed  Orientation:  Full (Time, Place, and Person)  Thought Content:  WDL  Suicidal Thoughts:  No  Homicidal Thoughts:  No  Memory:  Immediate;   Fair Recent;   Poor  Judgement:  Intact  Insight:  Fair  Psychomotor Activity:  Normal  Concentration:  Fair  Recall:  Poor  Fund of Knowledge:Fair  Language: Good  Akathisia:  Negative  Handed:  Right  AIMS (if indicated):     Assets:  Communication Skills Desire for Improvement Financial Resources/Insurance Housing Intimacy Leisure Time Resilience Social Support  ADL's:  Intact  Cognition: Impaired,  Mild  Sleep:      Medical Decision Making: Review of Psycho-Social Stressors (1), Review or order clinical lab tests (1), New Problem, with no additional work-up planned (3), Review of Last Therapy Session (1), Review of Medication Regimen & Side Effects (2) and Review of New Medication or Change in Dosage (2)  Treatment Plan Summary: Daily contact with patient to assess and evaluate  symptoms and progress in treatment and Medication management  Plan: Patient has capacity to make his own medical decisions based on my evaluation today Patient has significant cognitive decline mostly age related Patient benefit from medication for dementia like Aricept 5 mg once a day  May discontinue antipsychotic medication to prevent extrapyramidal symptoms May minimize medication that causes sedation including benzodiazepines to prevent increased confusion and agitation Patient does not meet criteria for psychiatric inpatient admission. Supportive therapy provided about ongoing stressors. Appreciate psychiatric consultation and follow up as clinically required Please contact [phone] or [phone] if needs further assistance  Disposition:  Patient will be referred to the outpatient medication management when medically stable.  Yahia Bottger,JANARDHAHA R. 11/18/2014 11:29 AM

## 2014-11-18 NOTE — Progress Notes (Signed)
Pt was educated on discharge instructions. Pt daughter contacted to inform her that pt was being discharged. Contacted daughter 2 more times and she did not answer. Pt is getting anxious about leaving. Pt states "I will take a taxi home if I have to." Pt signed discharge instructions and is waiting for daughter to arrive.

## 2014-11-18 NOTE — Discharge Instructions (Signed)
Follow with Primary MD Melida QuitterSIDHU,AMANJOT, MD in 7 days   Get CBC, CMP, 2 view Chest X ray checked  by Primary MD next visit.    Activity: As tolerated with Full fall precautions use walker/cane & assistance as needed   Disposition Home     Diet: Heart Healthy Low carb.  Accuchecks 4 times/day, Once in AM empty stomach and then before each meal. Log in all results and show them to your Prim.MD in 3 days. If any glucose reading is under 80 or above 300 call your Prim MD immidiately. Follow Low glucose instructions for glucose under 80 as instructed.   For Heart failure patients - Check your Weight same time everyday, if you gain over 2 pounds, or you develop in leg swelling, experience more shortness of breath or chest pain, call your Primary MD immediately. Follow Cardiac Low Salt Diet and 1.5 lit/day fluid restriction.   On your next visit with your primary care physician please Get Medicines reviewed and adjusted.   Please request your Prim.MD to go over all Hospital Tests and Procedure/Radiological results at the follow up, please get all Hospital records sent to your Prim MD by signing hospital release before you go home.   If you experience worsening of your admission symptoms, develop shortness of breath, life threatening emergency, suicidal or homicidal thoughts you must seek medical attention immediately by calling 911 or calling your MD immediately  if symptoms less severe.  You Must read complete instructions/literature along with all the possible adverse reactions/side effects for all the Medicines you take and that have been prescribed to you. Take any new Medicines after you have completely understood and accpet all the possible adverse reactions/side effects.   Do not drive, operating heavy machinery, perform activities at heights, swimming or participation in water activities or provide baby sitting services if your were admitted for syncope or siezures until you have seen  by Primary MD or a Neurologist and advised to do so again.  Do not drive when taking Pain medications.    Do not take more than prescribed Pain, Sleep and Anxiety Medications  Special Instructions: If you have smoked or chewed Tobacco  in the last 2 yrs please stop smoking, stop any regular Alcohol  and or any Recreational drug use.  Wear Seat belts while driving.   Please note  You were cared for by a hospitalist during your hospital stay. If you have any questions about your discharge medications or the care you received while you were in the hospital after you are discharged, you can call the unit and asked to speak with the hospitalist on call if the hospitalist that took care of you is not available. Once you are discharged, your primary care physician will handle any further medical issues. Please note that NO REFILLS for any discharge medications will be authorized once you are discharged, as it is imperative that you return to your primary care physician (or establish a relationship with a primary care physician if you do not have one) for your aftercare needs so that they can reassess your need for medications and monitor your lab values.      Radial Site Care Refer to this sheet in the next few weeks. These instructions provide you with information on caring for yourself after your procedure. Your caregiver may also give you more specific instructions. Your treatment has been planned according to current medical practices, but problems sometimes occur. Call your caregiver if you have any problems  or questions after your procedure. HOME CARE INSTRUCTIONS  You may shower the day after the procedure.Remove the bandage (dressing) and gently wash the site with plain soap and water.Gently pat the site dry.  Do not apply powder or lotion to the site.  Do not submerge the affected site in water for 3 to 5 days.  Inspect the site at least twice daily.  Do not flex or bend the  affected arm for 24 hours.  No lifting over 5 pounds (2.3 kg) for 5 days after your procedure.  Do not drive home if you are discharged the same day of the procedure. Have someone else drive you.  You may drive 24 hours after the procedure unless otherwise instructed by your caregiver.  Do not operate machinery or power tools for 24 hours.  A responsible adult should be with you for the first 24 hours after you arrive home. What to expect:  Any bruising will usually fade within 1 to 2 weeks.  Blood that collects in the tissue (hematoma) may be painful to the touch. It should usually decrease in size and tenderness within 1 to 2 weeks. SEEK IMMEDIATE MEDICAL CARE IF:  You have unusual pain at the radial site.  You have redness, warmth, swelling, or pain at the radial site.  You have drainage (other than a small amount of blood on the dressing).  You have chills.  You have a fever or persistent symptoms for more than 72 hours.  You have a fever and your symptoms suddenly get worse.  Your arm becomes pale, cool, tingly, or numb.  You have heavy bleeding from the site. Hold pressure on the site. Document Released: 08/18/2010 Document Revised: 10/08/2011 Document Reviewed: 08/18/2010 [facility] Patient Information 2015 [place], [place]. This information is not intended to replace advice given to you by your health care provider. Make sure you discuss any questions you have with your health care provider.

## 2014-11-18 NOTE — H&P (Signed)
Brendan Torres, is a 81 y.o. male  DOB Aug 28, 1931  MRN [id number].  Admission date:  11/12/2014  Admitting Physician  Nita Sells, MD  Discharge Date:  11/18/2014   Primary MD  Jolene Provost, MD  Recommendations for primary care physician for things to follow:    Check CBC, BMP in 1 week. Follow glycemic control and secondary risk factors for CAD closely.  Outpatient cardiology and psychiatry follow-up. One-time outpatient renal follow-up.   Admission Diagnosis  Dyspnea [R06.00] Acute coronary syndrome [I24.9] Elevated troponin [R79.89]   Discharge Diagnosis  Dyspnea [R06.00] Acute coronary syndrome [I24.9] Elevated troponin [R79.89]     Principal Problem:   Dyspnea Active Problems:   Elevated troponin   CVA, old, hemiparesis   Diabetes mellitus ty 2   HTN (hypertension)   Obesity (BMI 30.0-34.9)   Tremor   Hematuria, gross   Acute coronary syndrome   Acute renal injury   CAD S/P PCI 2011, RCA BMS 11/16/14   Acute delirium      Past Medical History  Diagnosis Date  . Stroke     left x 2  . Hypertension   . Diastolic CHF, chronic    . Atrial flutter   . UGIB (upper gastrointestinal bleed) 2009    duodenal ulcer  . OSA (obstructive sleep apnea)   . CKD (chronic kidney disease) stage 3, GFR 30-59 ml/min   . Sinus bradycardia     Not on BB  . Obesity   . Diabetes mellitus type 2, insulin dependent     Past Surgical History  Procedure Laterality Date  . Cardiac catheterization  06/2009    Dr Meda Coffee Seen, [facility] in [place]. Lmain 50%, LAD 50% (FFR 92), RI 90% (small), CFX 40%, OM 100% w/ L-L collaterals, RCA 40%, PDA 50% EF 45%. Med rx  . Prostatectomy    . US echocardiography  2015    EF 50-55%, mod-severe LVH, mild-mod MR, nl RV pressures, nl atrial size, Grade 1  diastolic dysfunction  . Left heart catheterization with coronary angiogram N/A 11/16/2014    Procedure: LEFT HEART CATHETERIZATION WITH CORONARY ANGIOGRAM;  Surgeon: Leonie Man, MD;  Location: [facility] CATH LAB;  Service: Cardiovascular;  Laterality: N/A;  . Percutaneous coronary stent intervention (pci-s)  11/16/2014    Procedure: PERCUTANEOUS CORONARY STENT INTERVENTION (PCI-S);  Surgeon: Leonie Man, MD;  Location: [facility] CATH LAB;  Service: Cardiovascular;;  prox RCA     DES       History of present illness and  Hospital Course:     Kindly see H&P for history of present illness and admission details, please review complete Labs, Consult reports and Test reports for all details in brief  HPI  The patient has become severely agitated and delirious over the last 24-36 hours. He is currently urinating on the floor (during my visit), and has pulled out numerous IVs as well as his foley cath x2. He is not oriented to place or situation, and appears to believe the staff are trying  to mislead him. His family has noted that this behavior is not entirely new, and state that he has a hx of "some kind of psychiatric disease" though futher details are not clear. The family has attempted to pursue outpt w/u recently, but was unable to do so due to noncompliance on behalf of the pt.    Hospital Course    Severe multivessel CAD - Elevated troponin w/o chest pain  Trop peaked at 0.78 - cardiac cath 4/19 revealed 100% CTO of the Circumflex, 90% proximal Ramus, 95% proximal RCA with 100% proximal RPL2 along with moderate to severe mid LAD disease - pt underwent bare metal stent placement in RCA lesion - will be placed on aspirin and Plavix, close outpatient follow-up with cardiology. Continue beta blocker, Imdur and statin. Completely symptom free.  Acute on CKD (stage 3, GFR 30-59 ml/min at baseline) Continue to hold diuretic and ACE inhibitor - w/ hydration his crt has returned to his baseline,  requests BCP to repeat BMP in a week and consider one-time outpatient renal follow-up.  Acute diastolic CHF exacerbation causing acute hypoxic respiratory failure Continue euvolemic and symptom-free, resume home dose of Lasix upon discharge monitor creatinine, we can diuretic dose closely.   Mildly elevated d-dimer venous duplex bilateral lower extremities without evidence of DVT - further eval not presently indicated   CVA, old, hemiparesis Now on aspirin and Plavix and statin  Diabetes mellitus 2  ? compliance with diet and regimen at home, he was hypoglycemic on his home regimen. Have reduced his Lantus to 15 units at night along with sliding scale. Testing supplies provided. Requested to do Accu-Cheks every before meals and at bedtime and maintain a logbook for PCP. Lab Results  Component Value Date   HGBA1C 7.7* 11/16/2014    CBG (last 3)   Recent Labs  11/17/14 0341 11/17/14 1724 11/18/14 0737  GLUCAP 92 117* 98      HTN Blood pressure currently well controlled on present regimen request PCP to monitor closely.   Obesity - Body mass index is 35.11 kg/(m^2).    Hx of OSA noncompliant w/ CPAP    Hematuria Due to traumatic removal of Foley catheter x2 per patient - currently says hematuria has resolved and does not want any further workup.   Severe acute agitation - Psychosis v/s acute delirium Seen by psychiatry and deemed competent, cleared for home discharge. He is calm and quiet this morning. Very cooperative. In no distress. He says he is just angry with his family. Denies any suicidal or homicidal ideations. Requests PCP to consider one-time outpatient psych follow-up if agitation becomes an issue in the future.    Discharge Condition: Stable   Follow UP  Follow-up Information    Follow up with SIDHU,AMANJOT, MD. Schedule an appointment as soon as possible for a visit in 1 week.   Specialty:  Internal Medicine   Why:  and your Psychiatrist    Contact information:   [place] [address] [phone]       Follow up with [place]. Schedule an appointment as soon as possible for a visit in 1 week.   Why:  CAD-stent placement   Contact information:   [place] [id number]         Discharge Instructions  and  Discharge Medications          Discharge Instructions    Discharge instructions    Complete by:  As directed   Follow with Primary MD SIDHU,AMANJOT, MD in 7 days   Get CBC, CMP, 2 view Chest X ray checked  by Primary MD next visit.    Activity: As tolerated with Full fall precautions use walker/cane & assistance as needed   Disposition Home     Diet: Heart Healthy Low carb.  Accuchecks 4 times/day, Once in AM empty stomach and then before each meal. Log in all results and show them to your Prim.MD in 3 days. If any glucose reading is under 80 or above 300 call your Prim MD immidiately. Follow Low glucose instructions for glucose under 80 as instructed.   For Heart failure patients - Check your Weight same time everyday, if you gain over 2 pounds, or you develop in leg swelling, experience more shortness of breath or chest pain, call your Primary MD immediately. Follow Cardiac Low Salt Diet and 1.5 lit/day fluid restriction.   On your next visit with your primary care physician please Get Medicines reviewed and adjusted.   Please request your Prim.MD to go over all Hospital Tests and Procedure/Radiological results at the follow up, please get all Hospital records sent to your Prim MD by signing hospital release before you go home.   If you experience worsening of your admission symptoms, develop shortness of breath, life threatening emergency, suicidal or homicidal thoughts you must seek medical attention immediately by calling 911 or calling your MD immediately  if symptoms less severe.  You Must read complete  instructions/literature along with all the possible adverse reactions/side effects for all the Medicines you take and that have been prescribed to you. Take any new Medicines after you have completely understood and accpet all the possible adverse reactions/side effects.   Do not drive, operating heavy machinery, perform activities at heights, swimming or participation in water activities or provide baby sitting services if your were admitted for syncope or siezures until you have seen by Primary MD or a Neurologist and advised to do so again.  Do not drive when taking Pain medications.    Do not take more than prescribed Pain, Sleep and Anxiety Medications  Special Instructions: If you have smoked or chewed Tobacco  in the last 2 yrs please stop smoking, stop any regular Alcohol  and or any Recreational drug use.  Wear Seat belts while driving.   Please note  You were cared for by a hospitalist during your hospital stay. If you have any questions about your discharge medications or the care you received while you were in the hospital after you are discharged, you can call the unit and asked to speak with the hospitalist on call if the hospitalist that took care of you is not available. Once you are discharged, your primary care physician will handle any further medical issues. Please note that NO REFILLS for any discharge medications will be authorized once you are discharged, as it is imperative that you return to your primary care physician (or establish a relationship with a primary care physician if you do not have one) for your aftercare needs so that they can reassess your need for medications and monitor your lab values.      Radial Site Care Refer to this sheet in the next few weeks. These instructions provide you with information on caring for yourself after your procedure. Your caregiver may also give you more specific instructions. Your treatment has been planned according to  current medical practices, but problems sometimes occur. Call your caregiver  if you have any problems or questions after your procedure. HOME CARE INSTRUCTIONS You may shower the day after the procedure.Remove the bandage (dressing) and gently wash the site with plain soap and water.Gently pat the site dry. Do not apply powder or lotion to the site. Do not submerge the affected site in water for 3 to 5 days. Inspect the site at least twice daily. Do not flex or bend the affected arm for 24 hours. No lifting over 5 pounds (2.3 kg) for 5 days after your procedure. Do not drive home if you are discharged the same day of the procedure. Have someone else drive you. You may drive 24 hours after the procedure unless otherwise instructed by your caregiver. Do not operate machinery or power tools for 24 hours. A responsible adult should be with you for the first 24 hours after you arrive home. What to expect: Any bruising will usually fade within 1 to 2 weeks. Blood that collects in the tissue (hematoma) may be painful to the touch. It should usually decrease in size and tenderness within 1 to 2 weeks. SEEK IMMEDIATE MEDICAL CARE IF: You have unusual pain at the radial site. You have redness, warmth, swelling, or pain at the radial site. You have drainage (other than a small amount of blood on the dressing). You have chills. You have a fever or persistent symptoms for more than 72 hours. You have a fever and your symptoms suddenly get worse. Your arm becomes pale, cool, tingly, or numb. You have heavy bleeding from the site. Hold pressure on the site. Document Released: 08/18/2010 Document Revised: 10/08/2011 Document Reviewed: 08/18/2010 [facility] Patient Information 2015 [place], [place]. This information is not intended to replace advice given to you by your health care provider. Make sure you discuss any questions you have with your health care provider.     Increase activity slowly    Complete  by:  As directed             Medication List    STOP taking these medications        dipyridamole-aspirin 200-25 MG per 12 hr capsule  Commonly known as:  AGGRENOX     lisinopril 40 MG tablet  Commonly known as:  PRINIVIL,ZESTRIL      TAKE these medications        amLODipine 10 MG tablet  Commonly known as:  NORVASC  Take 10 mg by mouth daily.     aspirin 81 MG EC tablet  Take 1 tablet (81 mg total) by mouth daily.     clopidogrel 75 MG tablet  Commonly known as:  PLAVIX  Take 1 tablet (75 mg total) by mouth daily with breakfast.     furosemide 40 MG tablet  Commonly known as:  LASIX  Take 40 mg by mouth daily.     glucose monitoring kit monitoring kit  1 each by Does not apply route 4 (four) times daily - after meals and at bedtime. 1 month Diabetic Testing Supplies for QAC-QHS accuchecks.Any brand OK     hydrALAZINE 50 MG tablet  Commonly known as:  APRESOLINE  Take 50 mg by mouth 3 (three) times daily.     insulin aspart 100 UNIT/ML injection  Commonly known as:  NOVOLOG  - Before each meal 3 times a day, 140-199 - 2 units, 200-250 - 4 units, 251-299 - 6 units,  300-349 - 8 units,  350 or above 10 units.  - Dispense syringes and needles as needed,  Ok to switch to PEN if approved.     Insulin Glargine 100 UNIT/ML Solostar Pen  Commonly known as:  LANTUS  Inject 15 Units into the skin daily at 10 pm.     Insulin Syringe-Needle U-100 25G X 1" 1 ML Misc  Any brand, for 4 times a day insulin SQ, 1 month supply.     isosorbide mononitrate 60 MG 24 hr tablet  Commonly known as:  IMDUR  Take 1 tablet (60 mg total) by mouth daily.     metoprolol tartrate 25 MG tablet  Commonly known as:  LOPRESSOR  Take 1 tablet (25 mg total) by mouth 2 (two) times daily.     potassium chloride 10 MEQ tablet  Commonly known as:  K-DUR,KLOR-CON  Take 10 mEq by mouth daily.     simvastatin 20 MG tablet  Commonly known as:  ZOCOR  Take 20 mg by mouth daily.           Diet and Activity recommendation: See Discharge Instructions above   Consults obtained - Cards, Psych   Major procedures and Radiology Reports - PLEASE review detailed and final reports for all details, in brief -     TTE - 4/17 - mild LVH, ejection fraction 55-60%, akinesis of the inferolateral myocardium, grade 1 diastolic dysfunction Cardiac cath w/ Bare Metal RCA stent placement - 4/19  Dg Chest 2 View (if Patient Has Fever And/or Copd)  11/12/2014   CLINICAL DATA:  Shortness of breath  EXAM: CHEST  2 VIEW  COMPARISON:  None.  FINDINGS: There is mild cardiomegaly. The aorta is tortuous and prominent in caliber. Mild interstitial coarsening without edema or pneumonia. No effusion or pneumothorax. No acute osseous findings.  IMPRESSION: 1. No pneumonia or edema. 2. Mild cardiomegaly. 3. Tortuous and likely dilated aorta.   Electronically Signed   By: Monte Fantasia M.D.   On: 11/12/2014 04:29    Micro Results      Recent Results (from the past 240 hour(s))  MRSA PCR Screening     Status: None   Collection Time: 11/12/14  8:26 AM  Result Value Ref Range Status   MRSA by PCR NEGATIVE NEGATIVE Final    Comment:        The GeneXpert MRSA Assay (FDA approved for NASAL specimens only), is one component of a comprehensive MRSA colonization surveillance program. It is not intended to diagnose MRSA infection nor to guide or monitor treatment for MRSA infections.        Today   Subjective:   Brendan Torres today has no headache,no chest abdominal pain,no new weakness tingling or numbness, feels much better wants to go home today.    Objective:   Blood pressure 137/73, pulse 60, temperature 98.5 F (36.9 C), temperature source Oral, resp. rate 17, height _0  (1.778 m), weight 112.5 kg (248 lb 0.3 oz), SpO2 94 %.   Intake/Output Summary (Last 24 hours) at 11/18/14 0955 Last data filed at 11/18/14 0541  Gross per 24 hour  Intake    120 ml  Output   1300 ml   Net  -1180 ml    Exam Awake Alert, Oriented x 3, No new F.N deficits, Normal affect [place].AT,PERRAL Supple Neck,No JVD, No cervical lymphadenopathy appriciated.  Symmetrical Chest wall movement, Good air movement bilaterally, CTAB RRR,No Gallops,Rubs or new Murmurs, No Parasternal Heave +ve B.Sounds, Abd Soft, Non tender, No organomegaly appriciated, No rebound -guarding or rigidity. No Cyanosis, Clubbing or edema, No new Rash or  bruise  Data Review   CBC w Diff:  Lab Results  Component Value Date   WBC 6.6 11/18/2014   HGB 12.4* 11/18/2014   HCT 38.6* 11/18/2014   PLT 307 11/18/2014    CMP:  Lab Results  Component Value Date   NA 141 11/18/2014   K 4.7 11/18/2014   CL 104 11/18/2014   CO2 28 11/18/2014   BUN 13 11/18/2014   CREATININE 1.54* 11/18/2014   PROT 7.2 11/18/2014   ALBUMIN 2.9* 11/18/2014   BILITOT 0.8 11/18/2014   ALKPHOS 58 11/18/2014   AST 28 11/18/2014   ALT 24 11/18/2014  . Lab Results  Component Value Date   HGBA1C 7.7* 11/16/2014    Lab Results  Component Value Date   CHOL 91 11/16/2014   HDL 28* 11/16/2014   LDLCALC 53 11/16/2014   TRIG 51 11/16/2014   CHOLHDL 3.3 11/16/2014     Total Time in preparing paper work, data evaluation and todays exam - 35 minutes  Thurnell Lose M.D on 11/18/2014 at 9:55 AM  [facility]   Office  [phone]

## 2014-11-18 NOTE — Progress Notes (Signed)
CARDIAC REHAB PHASE I   PRE:  Rate/Rhythm: 66 SR  BP:  Supine:   Sitting: 160/80  Standing:    SaO2:   MODE:  Ambulation: 140 ft   POST:  Rate/Rhythm: 80 SR  BP:  Supine:   Sitting: 152/87  Standing:    SaO2: 97%RA 0930-1026 Pt walked 140 ft with cane and asst x 1 without CP. Pt encouraged to let family walk with him as he is a little deconditioned. He stated they assist him all the time and sometimes it annoys him. Told him he needs to let them help him. MI ed done and pt able to answer teach back questions. Let ex ed and diet and MI booklet at bedside for family to see too. RN stated will re enforce ed with family. Discussed CRP 2 and pt stated he did not do last time with stent but would like to try . [place] [facility] [place]. Re enforced that he must take his meds especially plavix. Pt pleasant.   Luetta Nuttingharlene Lunabella Badgett, RN BSN  11/18/2014 10:21 AM

## 2014-11-18 NOTE — Progress Notes (Addendum)
Subjective:  Up in chair, calm today "I want to go home". No chest pain or SOB.  Objective:  Vital Signs in the last 24 hours: Temp:  [97.7 F (36.5 C)-98.5 F (36.9 C)] 98.5 F (36.9 C) (04/21 0540) Pulse Rate:  [54-75] 54 (04/21 0540) Resp:  [13-18] 17 (04/21 0540) BP: (132-166)/(66-103) 163/88 mmHg (04/21 0540) SpO2:  [91 %-94 %] 94 % (04/21 0540) Weight:  [248 lb 0.3 oz (112.5 kg)-250 lb (113.4 kg)] 248 lb 0.3 oz (112.5 kg) (04/21 0540)  Intake/Output from previous day:  Intake/Output Summary (Last 24 hours) at 11/18/14 0803 Last data filed at 11/18/14 0541  Gross per 24 hour  Intake    120 ml  Output   1300 ml  Net  -1180 ml    Physical Exam: General appearance: alert, cooperative, no distress and moderately obese Neck: no JVD Lungs: clear to auscultation bilaterally Heart: regular rate and rhythm Extremities: no edema, Rt radial site without hematoma Neurologic: Grossly normal   Rate: 72  Rhythm: normal sinus rhythm  Lab Results:  Recent Labs  11/17/14 0345 11/18/14 0553  WBC 6.1 6.6  HGB 12.0* 12.4*  PLT 320 307    Recent Labs  11/17/14 0345 11/18/14 0553  NA 143 141  K 4.4 4.7  CL 108 104  CO2 29 28  GLUCOSE 89 98  BUN 14 13  CREATININE 1.54* 1.54*   No results for input(s): TROPONINI in the last 72 hours.  Invalid input(s): CK, MB No results for input(s): INR in the last 72 hours. Meds . amLODipine  10 mg Oral Daily  . aspirin EC  81 mg Oral Daily  . clopidogrel  75 mg Oral Q breakfast  . heparin subcutaneous  5,000 Units Subcutaneous 3 times per day  . hydrALAZINE  50 mg Oral TID  . insulin aspart  0-9 Units Subcutaneous TID WC  . insulin glargine  20 Units Subcutaneous QHS  . isosorbide mononitrate  60 mg Oral Daily  . metoprolol tartrate  25 mg Oral BID  . simvastatin  20 mg Oral q1800    Imaging: Imaging results have been reviewed  Cardiac Studies: Echo 11/13/14 Study Conclusions  - Left ventricle: The cavity size was  normal. Wall thickness was increased in a pattern of mild LVH. Systolic function was normal. The estimated ejection fraction was in the range of 55% to 60%. There is akinesis of the inferolateral myocardium. Doppler parameters are consistent with abnormal left ventricular relaxation (grade 1 diastolic dysfunction). - Aortic valve: There was trivial regurgitation.  Impressions:  - Akinesis of the basal inferior lateral wall with overall preserved LV function; grade 1 diastolic dysfunction; calcified aortic valve with no AS by doppler; trace AI.   Assessment/Plan:  81 y.o. year old male with a reported history of CAD, MI x 2 (latest was 3 years ago, required cath and stenting x 2), CVA on Aggrenox, HTN, CHF, DM, HLD admitted 04/15 with shortness of breath x 3 days. SCr elevated. Pt was hydrated gently. Troponin elevated c/w ACS. Cath done 4/19 revealed 3V CAD with CTO of the CFX, 95% RCA, 70-80 LAD. The LAD lesion was not felt to be significant by FFR and the pt underwent PCI with BMS to the RCA 11/16/14.   Principal Problem:   Dyspnea Active Problems:   Elevated troponin   CVA, old, hemiparesis   Diabetes mellitus ty 2   HTN (hypertension)   Obesity (BMI 30.0-34.9)   Tremor   Hematuria,  gross   Acute coronary syndrome   Acute renal injury   CAD S/P PCI 2011, RCA BMS 11/16/14   Acute delirium   PLAN: Pt became aggressive towards family last yesterday night. Possibly sedation in cath lab exacerbated underlying early paranoid type dementia.   Pt now on Plavix and ASA 81 (Aggrenox stoped) (BMS). Residual LAD lesion not felt to be significant by FFR. CTO CFX.  Cr 1.54 improved with hydration post cath. S/P PCI to RCA; medical therapy for LCX occlusion and LAD disease. Will add BB and titrate Imdur to 60 mg for concomitant CAD. DC on  ASA/Plavix.  OK for DC. Have follow up with Dr. Delton SeeNelson Seen, [facility], [place], [place].  If needs to stay local, Dr. Melburn PopperNasher saw  first in consultation.   Donato SchultzSKAINS, Hameed Kolar, MD   11/18/2014 8:03 AM

## 2014-11-23 MED ORDER — FREESTYLE SYSTEM KIT: 1.0000 | PACK | Freq: Three times a day (TID) | Status: DC

## 2014-11-23 MED ORDER — FREESTYLE SYSTEM KIT: 1.0000 | PACK | Freq: Three times a day (TID) | Status: AC

## 2014-11-23 MED ORDER — "INSULIN SYRINGE-NEEDLE U-100 25G X 1"" 1 ML MISC": Status: AC

## 2015-12-10 IMAGING — CR DG CHEST 2V
2 series · 2 of 2 positions shown · non-contrast
Comparison: None.

CLINICAL DATA: Shortness of breath

EXAM:
CHEST  2 VIEW

[chest pa]
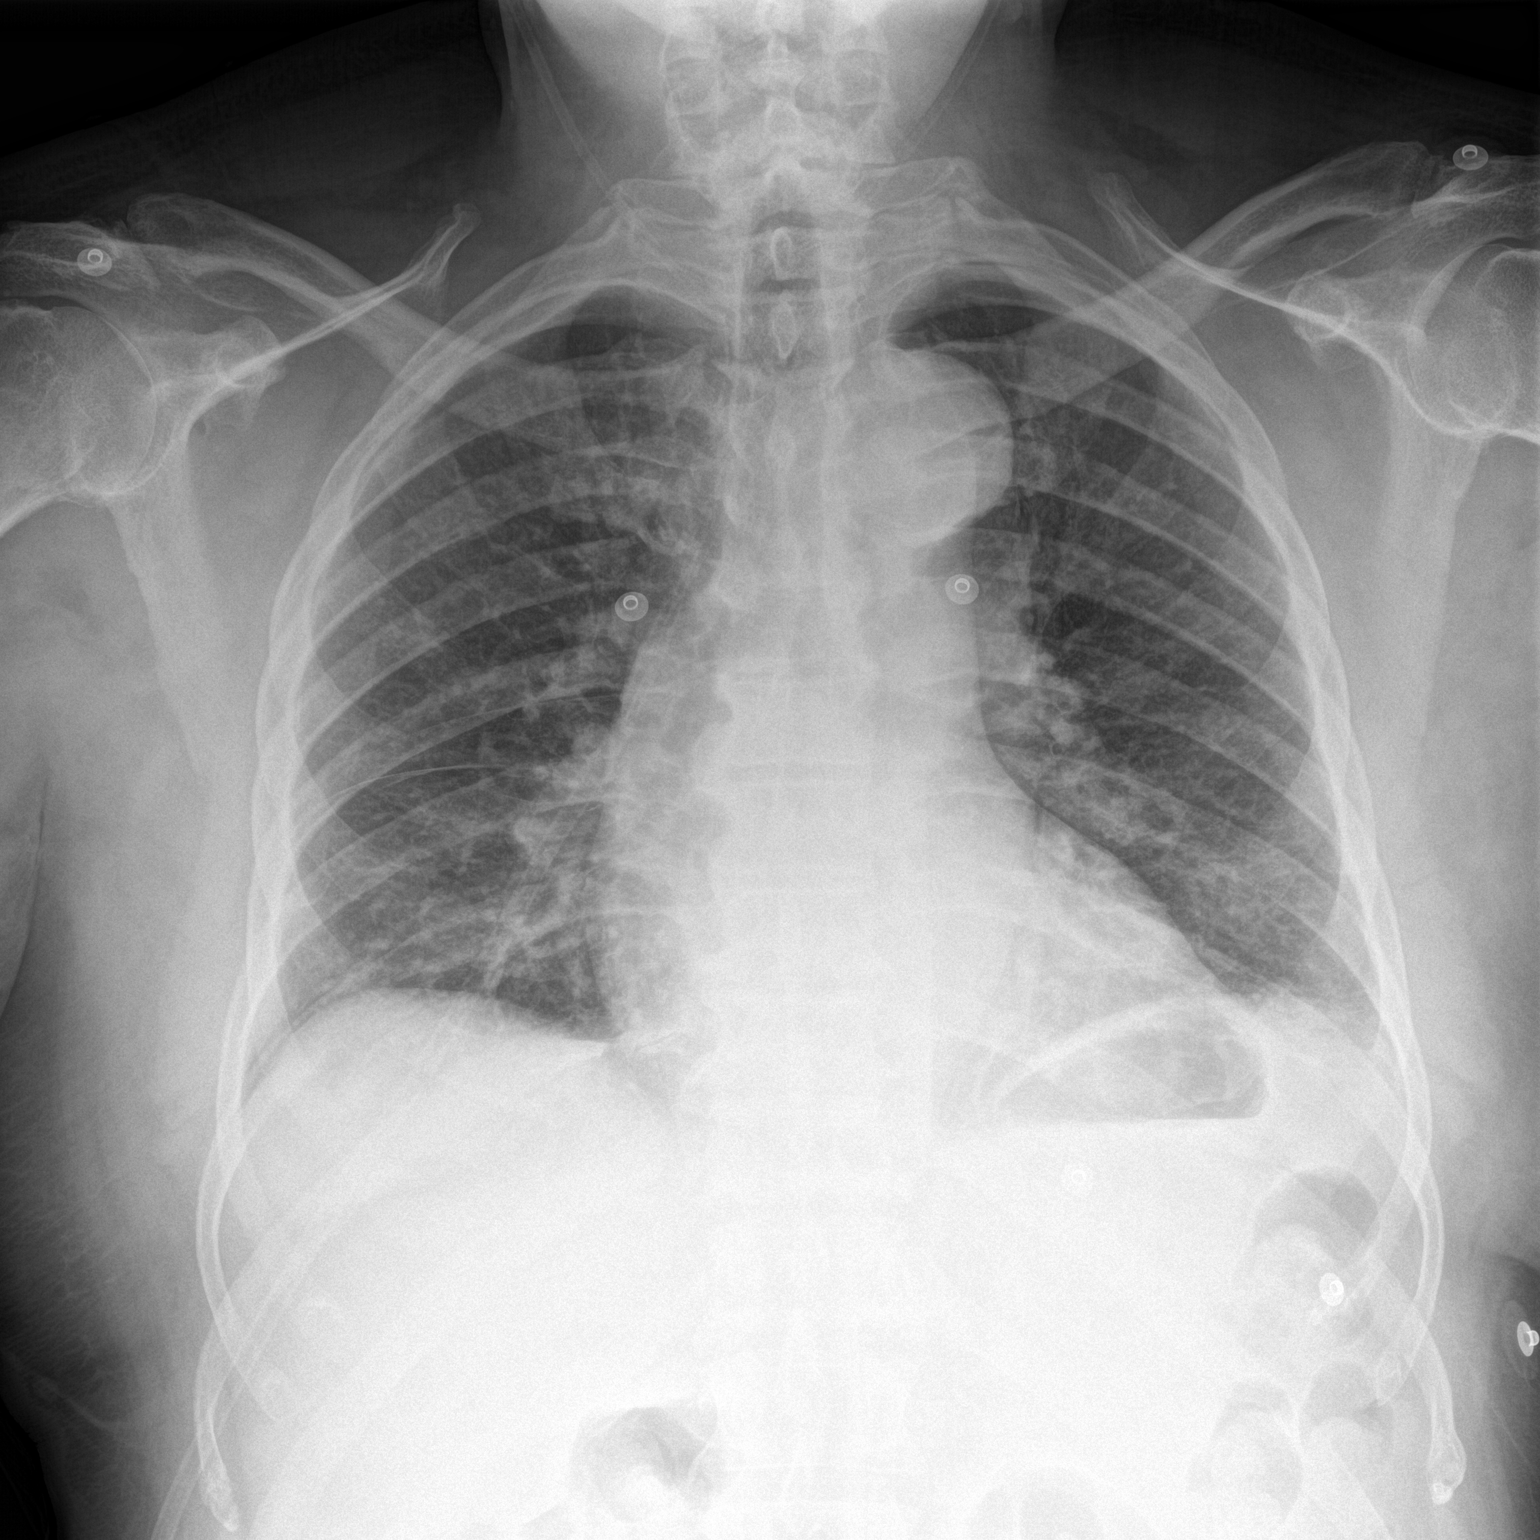

[chest lat]
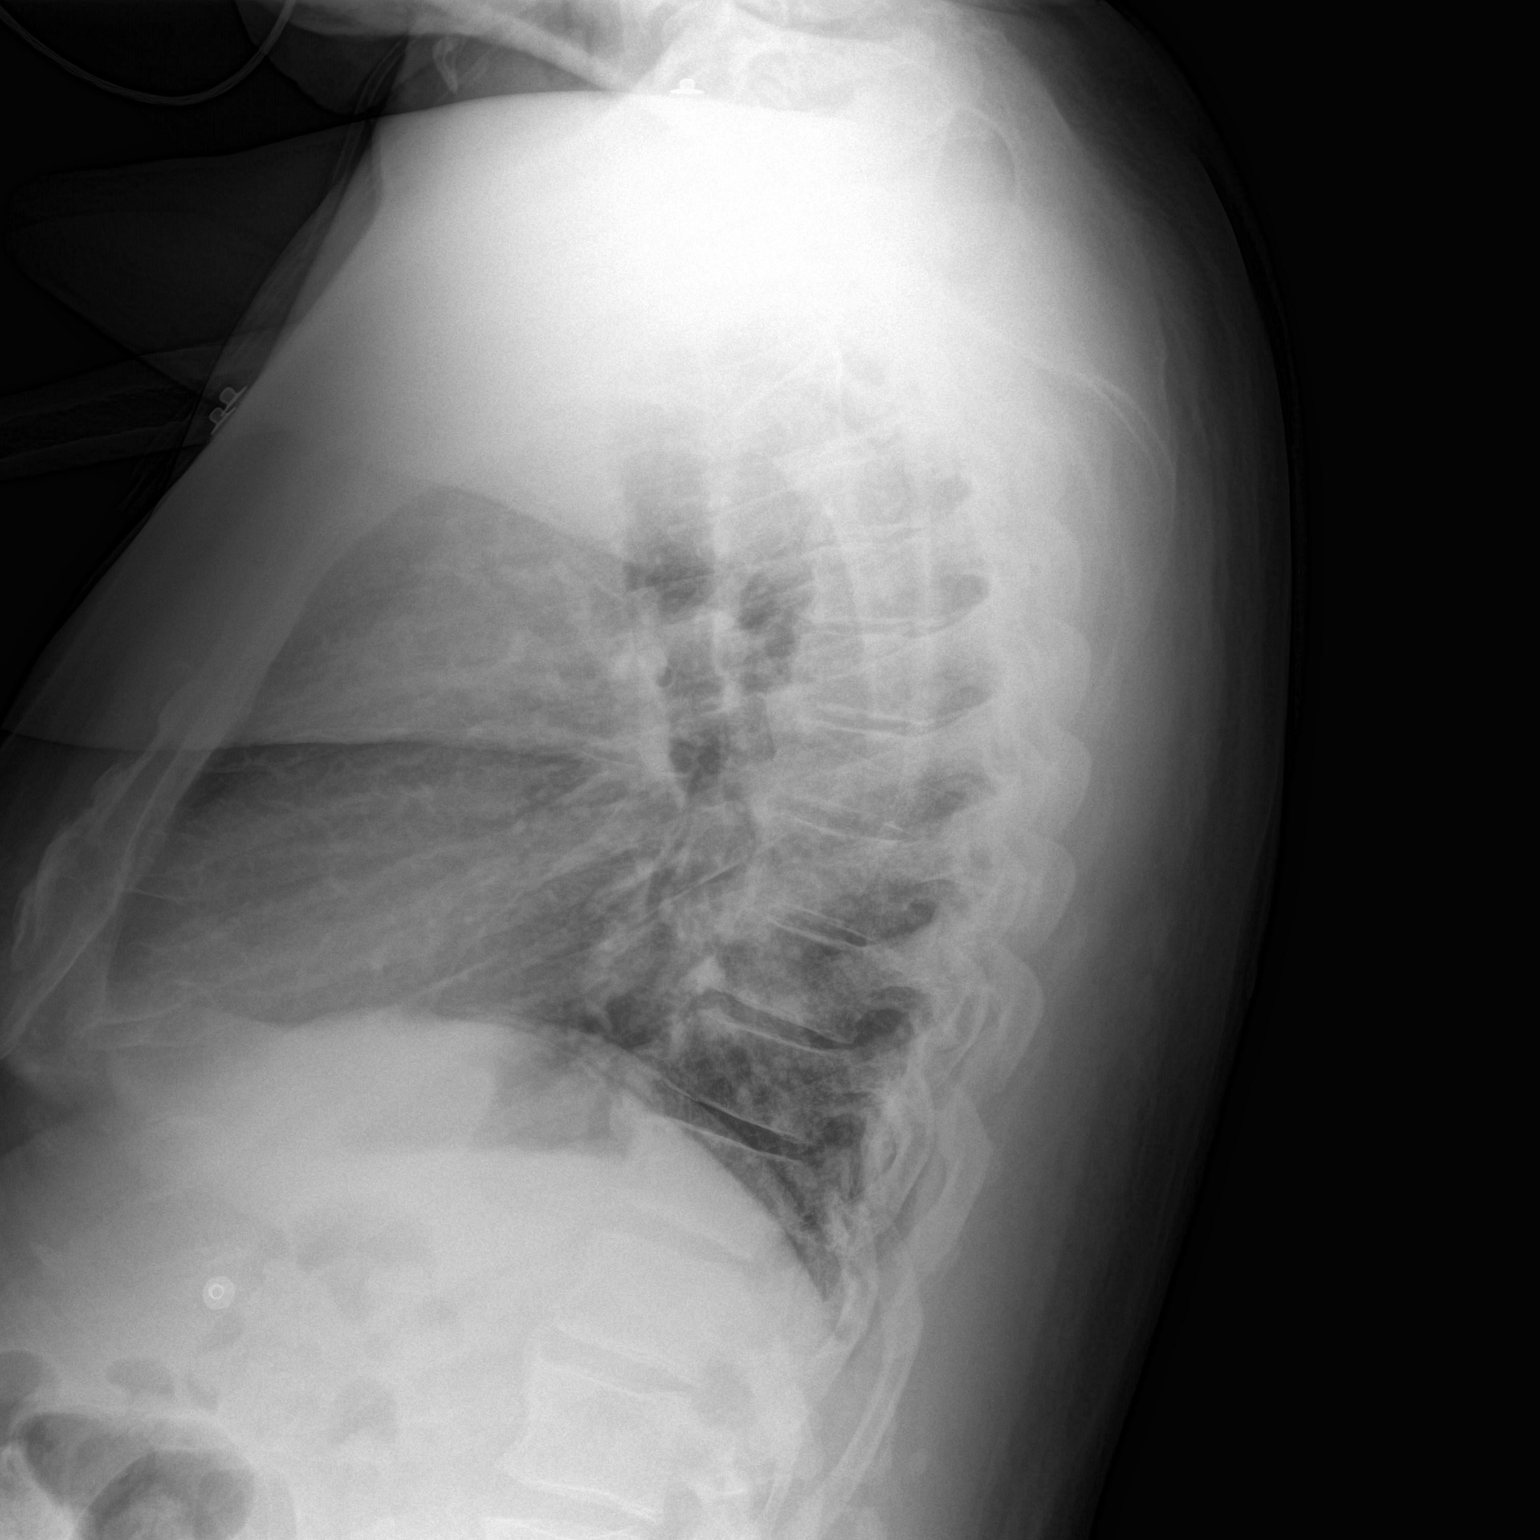

[2 of 2 positions shown; findings below may reference images not displayed]

FINDINGS: There is mild cardiomegaly. The aorta is tortuous and prominent in
caliber. Mild interstitial coarsening without edema or pneumonia. No
effusion or pneumothorax. No acute osseous findings.
IMPRESSION: 1. No pneumonia or edema.
2. Mild cardiomegaly.
3. Tortuous and likely dilated aorta.

## 2016-08-30 DEATH — deceased
